# Patient Record
Sex: Female | Born: 1980 | Race: White | Hispanic: No | Marital: Married | Smoking: Never smoker
Health system: Southern US, Community
[De-identification: ages and names within clinical notes are randomized; demographics above are authoritative.]

## PROBLEM LIST (undated history)

## (undated) DIAGNOSIS — Z5189 Encounter for other specified aftercare: Secondary | ICD-10-CM

## (undated) HISTORY — PX: EYE SURGERY: SHX253

## (undated) HISTORY — DX: Encounter for other specified aftercare: Z51.89

---
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
## 2004-03-16 ENCOUNTER — Ambulatory Visit (HOSPITAL_BASED_OUTPATIENT_CLINIC_OR_DEPARTMENT_OTHER): Admission: RE | Admit: 2004-03-16 | Discharge: 2004-03-16 | Payer: Self-pay | Admitting: Obstetrics and Gynecology

## 2004-03-16 ENCOUNTER — Ambulatory Visit (HOSPITAL_COMMUNITY): Admission: RE | Admit: 2004-03-16 | Discharge: 2004-03-16 | Payer: Self-pay | Admitting: Obstetrics and Gynecology

## 2004-03-16 ENCOUNTER — Encounter (INDEPENDENT_AMBULATORY_CARE_PROVIDER_SITE_OTHER): Payer: Self-pay | Admitting: Specialist

## 2008-02-21 ENCOUNTER — Inpatient Hospital Stay (HOSPITAL_COMMUNITY): Admission: AD | Admit: 2008-02-21 | Discharge: 2008-02-25 | Payer: Self-pay | Admitting: Obstetrics and Gynecology

## 2008-02-22 ENCOUNTER — Encounter (INDEPENDENT_AMBULATORY_CARE_PROVIDER_SITE_OTHER): Payer: Self-pay | Admitting: Obstetrics and Gynecology

## 2009-08-04 ENCOUNTER — Ambulatory Visit (HOSPITAL_COMMUNITY): Admission: RE | Admit: 2009-08-04 | Discharge: 2009-08-04 | Payer: Self-pay | Admitting: Obstetrics and Gynecology

## 2009-09-01 ENCOUNTER — Ambulatory Visit (HOSPITAL_COMMUNITY): Admission: RE | Admit: 2009-09-01 | Discharge: 2009-09-01 | Payer: Self-pay | Admitting: Obstetrics and Gynecology

## 2009-09-28 ENCOUNTER — Ambulatory Visit (HOSPITAL_COMMUNITY): Admission: RE | Admit: 2009-09-28 | Discharge: 2009-09-28 | Payer: Self-pay | Admitting: Obstetrics and Gynecology

## 2009-10-26 ENCOUNTER — Ambulatory Visit (HOSPITAL_COMMUNITY): Admission: RE | Admit: 2009-10-26 | Discharge: 2009-10-26 | Payer: Self-pay | Admitting: Obstetrics and Gynecology

## 2009-11-23 ENCOUNTER — Ambulatory Visit (HOSPITAL_COMMUNITY)
Admission: RE | Admit: 2009-11-23 | Discharge: 2009-11-23 | Payer: Self-pay | Source: Home / Self Care | Admitting: Obstetrics and Gynecology

## 2009-12-17 ENCOUNTER — Encounter (INDEPENDENT_AMBULATORY_CARE_PROVIDER_SITE_OTHER): Payer: Self-pay | Admitting: Obstetrics and Gynecology

## 2009-12-17 ENCOUNTER — Inpatient Hospital Stay (HOSPITAL_COMMUNITY): Admission: RE | Admit: 2009-12-17 | Discharge: 2009-12-20 | Payer: Self-pay | Admitting: Obstetrics and Gynecology

## 2010-08-31 ENCOUNTER — Encounter
Admission: RE | Admit: 2010-08-31 | Discharge: 2010-08-31 | Payer: Self-pay | Source: Home / Self Care | Attending: Endocrinology | Admitting: Endocrinology

## 2010-09-25 ENCOUNTER — Other Ambulatory Visit: Payer: Self-pay | Admitting: Endocrinology

## 2010-09-25 DIAGNOSIS — E041 Nontoxic single thyroid nodule: Secondary | ICD-10-CM

## 2010-10-12 ENCOUNTER — Other Ambulatory Visit: Payer: Self-pay

## 2010-10-19 ENCOUNTER — Other Ambulatory Visit (HOSPITAL_COMMUNITY)
Admission: RE | Admit: 2010-10-19 | Discharge: 2010-10-19 | Disposition: A | Discharge status: Home / Self Care | Payer: BC Managed Care – PPO | Source: Ambulatory Visit | Attending: Interventional Radiology | Admitting: Interventional Radiology

## 2010-10-19 ENCOUNTER — Other Ambulatory Visit: Payer: Self-pay

## 2010-10-19 ENCOUNTER — Other Ambulatory Visit: Payer: Self-pay | Admitting: Interventional Radiology

## 2010-10-19 DIAGNOSIS — E049 Nontoxic goiter, unspecified: Secondary | ICD-10-CM | POA: Insufficient documentation

## 2010-10-20 ENCOUNTER — Ambulatory Visit
Admission: RE | Admit: 2010-10-20 | Discharge: 2010-10-20 | Disposition: A | Discharge status: Home / Self Care | Payer: BC Managed Care – PPO | Source: Ambulatory Visit | Attending: Endocrinology | Admitting: Endocrinology

## 2010-10-20 ENCOUNTER — Other Ambulatory Visit: Payer: Self-pay | Admitting: Interventional Radiology

## 2010-10-20 DIAGNOSIS — E041 Nontoxic single thyroid nodule: Secondary | ICD-10-CM

## 2010-11-23 LAB — CBC
MCV: 85.8 fL (ref 78.0–100.0)
Platelets: 131 10*3/uL — ABNORMAL LOW (ref 150–400)
RDW: 14.8 % (ref 11.5–15.5)
WBC: 13.4 10*3/uL — ABNORMAL HIGH (ref 4.0–10.5)

## 2010-11-24 LAB — RPR: RPR Ser Ql: NONREACTIVE

## 2010-11-24 LAB — CBC
HCT: 33.4 % — ABNORMAL LOW (ref 36.0–46.0)
Platelets: 156 10*3/uL (ref 150–400)
RDW: 15.2 % (ref 11.5–15.5)
WBC: 11.1 10*3/uL — ABNORMAL HIGH (ref 4.0–10.5)

## 2010-11-24 LAB — TYPE AND SCREEN
ABO/RH(D): O POS
Antibody Screen: NEGATIVE

## 2011-01-18 NOTE — H&P (Signed)
[name], DESCHEPPER NO.:  [id number]   MEDICAL RECORD NO.:  [id number]          PATIENT TYPE:  INP   LOCATION:  9168                          FACILITY:  [facility]   PHYSICIAN:  Carrie Oconnell, M.D.DATE OF BIRTH:  01-07-1981   DATE OF ADMISSION:  02/21/2008  DATE OF DISCHARGE:                              HISTORY & PHYSICAL   Indication for induction LGA.  She is a 30 year old white female and G1,  P0 with 38 and half weeks.  Estimated fetal weight greater than the 98th  percentile and unfavorable cervix who presents desiring induction.  Risk  and benefits were discussed.  Acknowledges the possible risks and  accuracies of ultrasound in conjunction with the risk of induction given  unfavorable cervix and persistent estimated fetal weight of greater than  98th percentile.  She wishes to proceed.   PAST MEDICAL HISTORY:  Remarkable for HPV.  No medical or surgical  hospitalizations.   OBSTETRIC HISTORY:  Noncontributory.   FAMILY HISTORY:  Hypertension, diabetes, and colon cancer.   SOCIAL HISTORY:  She is a nonsmoker.  Nondrinker.  She denies domestic  or physical violence.   ALLERGIES:  No known drug allergies.   Prenatal course complicated by size-date discrepancy and presumed LGA.   PHYSICAL EXAMINATION:  GENERAL:  She is a well-developed, well-nourished  white female in no acute distress.  HEENT:  Normal.  LUNGS:  Clear.  HEART:  Regular rate and rhythm.  ABDOMEN:  Soft, gravid, and nontender.  Estimated fetal weight by  Lepel's 8-1/2 to 9 pounds.  Cervix is 2-3 cm, 70-80% effaced, vertex -1.  EXTREMITIES:  No cords.  NEUROLOGIC:  Nonfocal.  SKIN:  Intact.   NST is reactive.   IMPRESSION:  A 38-plus-week intrauterine pregnancy with presumed large  for gestational age for induction.   PLAN:  To proceed with Pitocin epidural as needed and anticipated  attempts at vaginal delivery.      Carrie Oconnell, M.D.  Electronically Signed     RJT/MEDQ  D:  02/21/2008  T:  02/22/2008  Job:  [id number]

## 2011-01-18 NOTE — Op Note (Signed)
Carrie Oconnell, TAULBEE NO.:  [id number]   MEDICAL RECORD NO.:  [id number]          PATIENT TYPE:  INP   LOCATION:  9373                          FACILITY:  [facility]   PHYSICIAN:  Lenoard Aden, M.D.DATE OF BIRTH:  1981/08/05   DATE OF PROCEDURE:  DATE OF DISCHARGE:                               OPERATIVE REPORT   PREOPERATIVE DIAGNOSIS:  Term intrauterine pregnancy, failure to  descend.   POSTOPERATIVE DIAGNOSIS:  Term intrauterine pregnancy, severe uterine  atony.   PROCEDURE:  Primary low segment transverse cesarean section and B-Lynch  suturing for treatment of uterine atony.   SURGEON:  Lenoard Aden, M.D. and Marlinda Mike, C.N.M.   ANESTHESIA:  Spinal and epidural by Dr. Pamalee Leyden.   ESTIMATED BLOOD LOSS:  2025 mL.   COMPLICATIONS:  Uterine atony as noted.   DRAINS:  Foley.   COUNTS:  Correct.   The patient to recovery in stable condition.   OPERATIVE NOTE:  After being apprised of risks of anesthesia, infection,  bleeding, injury to abdominal organs, need for repair, the labor's  immediate complications to include bowel and bladder injury, the patient  was brought to the operating room where she was administered epidural  anesthetic.  After awaiting approximately 10 minutes, the epidural  anesthetic was found to be inadequate, a spinal was placed by Dr.  Pamalee Leyden.  The patient was prepped and draped in the usual sterile  fashion.  A Foley catheter was previously placed.  After achieving  adequate anesthesia at this time __________  was placed.  Then incision  was made with a scalpel and carried down to the fascia.  Then carried  transversely.  Rectus muscles were identified bluntly over the midline  in the peritoneum.  Bladder blade was placed.  Vesicoperitoneum was  opened sharply in the lower uterine segment for a hysterotomy.  __________  The infant was handed over to the pediatrician.  Infant with  Apgars  of 9 at 9.  Cord blood obtained and  sent to laboratory.  Three  vessel cord noted.  Uterus was curetted using dry lap,  and closed with  2 running intracuticular layers of 0 Vicryl suture without difficulty.  At this time, it was noted that the progressive  uterine atony was  encountered.  Upon placement of 40 units of injection, an IV solution  given directly into the myometrium with minimal __________  severe  uterine atony was still encountered.  At this time a decision was made  to proceed with B-Lynch suture.  Hysterectomy table was called for; the  patient was typed and screened and  crossmatched with  2 units of blood.  At this time, consultation with Dr. Mia Creek was had regarding the  placement of  Lynch sutures and multiple B-Lynch modification sutures  were placed using a #1Vicryl with a long Keith needle placed front to  back, back to front square knot type fashion and tied with compression  sutures.  These were started in the upper fundal region from side, front  to back, side to side, taken down in front to side and  down to the lower  uterine segment, with care to avoid surrounding injury.  At this time,  good compression of the uterus was achieved.  Good hemostasis was  achieved.  The uterine atony  was marked at this time; the patient's  vitals were stable.  Blood pressure was good and stable.  At this time,  good hemostasis was noted.  Irrigation was accomplished.  Bladder flaps  sutured and were noted to be hemostatic.  Flaps were then closed with 0  Monocryl.  Skin reapproximated with skin staples.  The patient tolerated  the patient procedure well.  She was transferred to recovery room in  guarded condition and stable.      Lenoard Aden, M.D.  Electronically Signed     RJT/MEDQ  D:  02/23/2008  T:  02/23/2008  Job:  [id number]

## 2011-01-21 NOTE — Op Note (Signed)
NAME:  Carrie Oconnell, Carrie Oconnell                  ACCOUNT NO.:  [id number]   MEDICAL RECORD NO.:  [id number]                   PATIENT TYPE:  AMB   LOCATION:  [facility]                                 FACILITY:  [facility]   PHYSICIAN:  Sherry A. Rosalio Macadamia, M.D.           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   PROCEDURE:  Laser vaporization of the cervix and loop electrosurgical  excision procedure of the cervix.   SURGEON:  Sherry A. Rosalio Macadamia, M.D.   ANESTHESIA:  General.   INDICATIONS:  This is a 30 year old G0, P0, woman who has had abnormal Pap  smears, which were evaluated in the office.  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  Because of the degree of abnormality, the location of the abnormality, and  the fact that the patient has a very sensitive vagal reaction and has a  history of fainting in the office, the patient is brought to the operating  room for treatment of her cervical dysplasia in the operating room.   FINDINGS:  Cervix consistent with CIN-II changes out on the portio of the  cervix.   PROCEDURE:  The patient was brought into the operating room, given adequate  general anesthesia.  She is placed in the dorsal lithotomy position.  Her  perineum was draped with wet towels, a speculum was placed within the  vagina.  The vagina was washed with acetic acid.  A paracervical block was  administered with 1% Nesacaine.  The abnormal areas on the cervix were  outlined with the laser with 5 watts power.  Using the small loop, a LEEP  was taken in the center of the cervix to a depth of approximately 8 mm.  The  specimen was removed and pinned out on cork.  The  remaining abnormal areas  of the cervix were then lasered with 35 watts power.  The portio was lasered  to a depth of approximately 3 mm and the os was no further lasering needed  to be performed because the LEEP had been performed; however, using 5 watts  power with a brush technique, the external areas around the portio were  lightly lasered and the surface of the cervix was lasered where the LEEP had  been performed to create adequate hemostasis.  Once hemostasis was present,  the Monsel's solution was placed against the cervix.   ADDENDUM:  Prior to using the laser, endocervical curettings had been  obtained.   The speculum was then removed from the vagina.  The patient was taken out of  the dorsal lithotomy position.  She was awakened.  She was extubated.  She  was moved from the operating table to a stretcher in stable  condition.  Complications were none.  Estimated blood loss less than 5 mL.                                               Sherry A. Rosalio Macadamia, M.D.   SAD/MEDQ  D:  03/16/2004  T:  03/16/2004  Job:  [id number]

## 2011-01-21 NOTE — Discharge Summary (Signed)
Carrie Oconnell, GULLATT               ACCOUNT NO.:  [id number]   MEDICAL RECORD NO.:  [id number]          PATIENT TYPE:  INP   LOCATION:  9131                          FACILITY:  [facility]   PHYSICIAN:  Lenoard Aden, M.D.DATE OF BIRTH:  [birth year]   DATE OF ADMISSION:  02/21/2008  DATE OF DISCHARGE:  02/25/2008                               DISCHARGE SUMMARY   The patient underwent complicated primary C-section.  Procedures  complicated by uterine atony.   Postoperative course complicated by anemia and need for transfusion.  The patient tolerated this procedure well.   She was discharged to home on postop day #3.  Discharge teaching done.  Vicodin and iron given.  Follow up in the office in 1 week for  hemoglobin.      Lenoard Aden, M.D.  Electronically Signed     RJT/MEDQ  D:  03/15/2008  T:  03/16/2008  Job:  [id number]

## 2011-06-02 LAB — DIC (DISSEMINATED INTRAVASCULAR COAGULATION)PANEL
Fibrinogen: 343
Fibrinogen: 359
Platelets: 147 — ABNORMAL LOW
Prothrombin Time: 14.7
Smear Review: NONE SEEN
Smear Review: NONE SEEN
aPTT: 34

## 2011-06-02 LAB — CBC
HCT: 20.2 — ABNORMAL LOW
Hemoglobin: 11.2 — ABNORMAL LOW
Hemoglobin: 13.3
Hemoglobin: 8.3 — ABNORMAL LOW
MCHC: 33.7
MCHC: 34.7
MCHC: 34.7
MCHC: 34.9
MCV: 96.5
MCV: 96.7
Platelets: 101 — ABNORMAL LOW
Platelets: 140 — ABNORMAL LOW
Platelets: 146 — ABNORMAL LOW
Platelets: 95 — ABNORMAL LOW
RBC: 1.75 — ABNORMAL LOW
RBC: 2.09 — ABNORMAL LOW
RBC: 2.46 — ABNORMAL LOW
RBC: 3.41 — ABNORMAL LOW
RDW: 13.1
RDW: 16.7 — ABNORMAL HIGH
WBC: 11.9 — ABNORMAL HIGH
WBC: 12.5 — ABNORMAL HIGH
WBC: 16 — ABNORMAL HIGH
WBC: 19.8 — ABNORMAL HIGH

## 2011-06-02 LAB — CROSSMATCH
ABO/RH(D): O POS
Antibody Screen: NEGATIVE

## 2011-06-02 LAB — RPR: RPR Ser Ql: NONREACTIVE

## 2011-06-02 LAB — HEMOGLOBIN AND HEMATOCRIT, BLOOD
HCT: 25.4 — ABNORMAL LOW
Hemoglobin: 8.9 — ABNORMAL LOW
Hemoglobin: 8.9 — ABNORMAL LOW

## 2011-11-09 ENCOUNTER — Encounter: Payer: Self-pay | Admitting: Internal Medicine

## 2011-11-09 ENCOUNTER — Other Ambulatory Visit (INDEPENDENT_AMBULATORY_CARE_PROVIDER_SITE_OTHER): Payer: BC Managed Care – PPO

## 2011-11-09 ENCOUNTER — Ambulatory Visit (INDEPENDENT_AMBULATORY_CARE_PROVIDER_SITE_OTHER): Payer: BC Managed Care – PPO | Admitting: Internal Medicine

## 2011-11-09 VITALS — BP 90/62 | HR 68 | Temp 98.5°F | Ht 63.0 in | Wt 132.0 lb

## 2011-11-09 DIAGNOSIS — R5381 Other malaise: Secondary | ICD-10-CM

## 2011-11-09 DIAGNOSIS — R5383 Other fatigue: Secondary | ICD-10-CM

## 2011-11-09 DIAGNOSIS — E049 Nontoxic goiter, unspecified: Secondary | ICD-10-CM

## 2011-11-09 DIAGNOSIS — J209 Acute bronchitis, unspecified: Secondary | ICD-10-CM

## 2011-11-09 LAB — BASIC METABOLIC PANEL
BUN: 9 mg/dL (ref 6–23)
Chloride: 102 mEq/L (ref 96–112)
GFR: 99.69 mL/min (ref >60.00)
Potassium: 4.1 mEq/L (ref 3.5–5.1)
Sodium: 140 mEq/L (ref 135–145)

## 2011-11-09 LAB — HEPATIC FUNCTION PANEL
ALT: 18 U/L (ref 0–35)
AST: 17 U/L (ref 0–37)
Alkaline Phosphatase: 62 U/L (ref 39–117)
Total Bilirubin: 0 mg/dL — ABNORMAL LOW (ref 0.3–1.2)

## 2011-11-09 LAB — TSH: TSH: 0.61 u[IU]/mL (ref 0.35–5.50)

## 2011-11-09 LAB — CBC WITH DIFFERENTIAL/PLATELET
Basophils Absolute: 0 10*3/uL (ref 0.0–0.1)
Basophils Relative: 0.3 % (ref 0.0–3.0)
Eosinophils Absolute: 0.2 10*3/uL (ref 0.0–0.7)
Hemoglobin: 14.5 g/dL (ref 12.0–15.0)
Lymphocytes Relative: 30.1 % (ref 12.0–46.0)
MCHC: 33.3 g/dL (ref 30.0–36.0)
Monocytes Relative: 10.6 % (ref 3.0–12.0)
Neutro Abs: 4.3 10*3/uL (ref 1.4–7.7)
Neutrophils Relative %: 56 % (ref 43.0–77.0)
RBC: 4.77 Mil/uL (ref 3.87–5.11)
RDW: 12.7 % (ref 11.5–14.6)

## 2011-11-09 MED ORDER — HYDROCODONE-HOMATROPINE 5-1.5 MG/5ML PO SYRP: 5.0000 mL | ORAL_SOLUTION | Freq: Four times a day (QID) | ORAL | Status: AC | PRN

## 2011-11-09 MED ORDER — AZITHROMYCIN 250 MG PO TABS: ORAL_TABLET | ORAL | Status: AC

## 2011-11-09 NOTE — Progress Notes (Signed)
  Subjective:    HPI  complains of cold symptoms  Onset >1 week ago, progressive symptoms  associated with rhinorrhea, sneezing, sore throat, mild headache and low grade fever Also myalgias, sinus pressure and mild-mod chest congestion No relief with OTC meds Precipitated by sick contacts - work and home  Also would like labs checked today for thyroid and iron  Past Medical History  Diagnosis Date  . History of chicken pox   . Thyroid nodule     Review of Systems Constitutional: No fever or night sweats, no unexpected weight change; complains of fatigue x 2 months Pulmonary: No pleurisy or hemoptysis Cardiovascular: No chest pain or palpitations     Objective:   Physical Exam BP 90/62  Pulse 68  Temp(Src) 98.5 F (36.9 C) (Oral)  Ht 5\' 3"  (1.6 m)  Wt 132 lb (59.875 kg)  BMI 23.38 kg/m2  SpO2 98% GEN: mildly ill appearing and audible chest congestion HENT: NCAT, mild sinus tenderness bilaterally, nares with clear discharge, oropharynx mod erythema, no exudate Eyes: Vision grossly intact, mild L conjunctivitis Neck: B nodular goiter, non tender, no LAD  Lungs: scattered rhonchi, no wheeze, no increased work of breathing Cardiovascular: Regular rate and rhythm, no bilateral edema  Lab Results  Component Value Date   WBC 13.4* 12/18/2009   HGB 7.4 DELTA CHECK NOTED REPEATED TO VERIFY* 12/18/2009   HCT 22.6* 12/18/2009   PLT 131* 12/18/2009   INR 1.2 02/23/2008       Assessment & Plan:  Viral URI > acute bronchitis Mild conjunctivitis, L Cough, postnasal drip related to above Fatigue x 2 mo Thyroid goiter/nodule - benign bx 2012   Empiric antibiotics prescribed due to symptom duration greater than 7 days Prescription cough suppression - new prescriptions done Symptomatic care with Tylenol or Advil, hydration and rest -  salt gargle advised as needed  Fatigue nonspecific - check labs  Thyroid nodule - ?hypothyroid - no follow up >70mo- hcek labs now and [place]  annually

## 2011-11-09 NOTE — Patient Instructions (Signed)
It was good to see you today. Zpak antibiotics and Hydromet cough syrup for bronchitis symptoms - Your prescription(s) have been submitted to your pharmacy. Please take as directed and contact our office if you believe you are having problem(s) with the medication(s). Test(s) ordered today. Your results will be called to you after review (48-72hours after test completion). If any changes need to be made, you will be notified at that time. Please schedule followup in 6 months to monitor thyroid, call sooner if other problems.

## 2011-12-12 ENCOUNTER — Other Ambulatory Visit (INDEPENDENT_AMBULATORY_CARE_PROVIDER_SITE_OTHER): Payer: BC Managed Care – PPO

## 2011-12-12 ENCOUNTER — Encounter: Payer: Self-pay | Admitting: Internal Medicine

## 2011-12-12 ENCOUNTER — Ambulatory Visit (INDEPENDENT_AMBULATORY_CARE_PROVIDER_SITE_OTHER): Payer: BC Managed Care – PPO | Admitting: Internal Medicine

## 2011-12-12 VITALS — BP 90/60 | HR 76 | Temp 98.4°F | Resp 16 | Ht 63.0 in | Wt 132.0 lb

## 2011-12-12 DIAGNOSIS — E049 Nontoxic goiter, unspecified: Secondary | ICD-10-CM

## 2011-12-12 LAB — T4, FREE: Free T4: 0.9 ng/dL (ref 0.60–1.60)

## 2011-12-12 NOTE — Assessment & Plan Note (Addendum)
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

## 2011-12-12 NOTE — Progress Notes (Signed)
  Subjective:    Patient ID: Carrie Oconnell, female    DOB: 13-Nov-1980, 31 y.o.   MRN: [id number]  HPI  Concerned about thyroid goiter Reports increased swelling and pain in anterior neck last week Symptoms lasted 2 days before spontaneous resolution Not consistently taking thyroid replacement because her understanding is "medication is for cosmetic purposes only" Concerned about possible hypothyroid symptoms including fatigue, weight gain, skin changes and dysphoric mood Also reports difficulty swallowing at times with increased phlegm production Denies cough, shortness of breath or heartburn symptoms  Past Medical History  Diagnosis Date  . History of chicken pox   . 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    Review of Systems  HENT: Negative for facial swelling, sneezing, neck stiffness and postnasal drip.   Cardiovascular: Negative for chest pain and leg swelling.       Objective:   Physical Exam BP 90/60  Pulse 76  Temp(Src) 98.4 F (36.9 C) (Oral)  Resp 16  Ht 5\' 3"  (1.6 m)  Wt 132 lb (59.875 kg)  BMI 23.38 kg/m2  SpO2 98% Wt Readings from Last 3 Encounters:  12/12/11 132 lb (59.875 kg)  11/09/11 132 lb (59.875 kg)   Constitutional: She appears well-developed and well-nourished. No distress.  Eyes: Conjunctivae and EOM are normal. Pupils are equal, round, and reactive to light. No scleral icterus.  Neck: L>R thyroid nodules with thyromegaly present.  nontender to palpation Cardiovascular: Normal rate, regular rhythm and normal heart sounds.  No murmur heard. No BLE edema. Pulmonary/Chest: Effort normal and breath sounds normal. No respiratory distress. She has no wheezes.  Psychiatric: She has a dysphoric mood and affect. Her behavior is normal. Judgment and thought content normal.   Lab Results  Component Value Date   WBC 7.7 11/09/2011   HGB 14.5 11/09/2011   HCT 43.6 11/09/2011   PLT 215.0 11/09/2011   GLUCOSE 86 11/09/2011   ALT 18 11/09/2011    AST 17 11/09/2011   NA 140 11/09/2011   K 4.1 11/09/2011   CL 102 11/09/2011   CREATININE 0.7 11/09/2011   BUN 9 11/09/2011   CO2 31 11/09/2011   TSH 0.61 11/09/2011   INR 1.2 02/23/2008        Assessment & Plan:  See problem list. Medications and labs reviewed today.

## 2011-12-12 NOTE — Patient Instructions (Addendum)
It was good to see you today. Test(s) ordered today. Your results will be called to you after review (48-72hours after test completion). If any changes need to be made, you will be notified at that time. we'll make referral for repeat thyroid ultrasound. Our office will contact you regarding appointment(s) once made. we will send to your prior provider(s) for "release of records" as discussed today -  Please schedule followup in 2 weeks to review thyroid results in person, call sooner if other problems.

## 2011-12-15 ENCOUNTER — Ambulatory Visit
Admission: RE | Admit: 2011-12-15 | Discharge: 2011-12-15 | Disposition: A | Discharge status: Home / Self Care | Payer: BC Managed Care – PPO | Source: Ambulatory Visit | Attending: Internal Medicine | Admitting: Internal Medicine

## 2011-12-15 DIAGNOSIS — E049 Nontoxic goiter, unspecified: Secondary | ICD-10-CM

## 2011-12-27 ENCOUNTER — Encounter: Payer: Self-pay | Admitting: Internal Medicine

## 2011-12-27 ENCOUNTER — Ambulatory Visit (INDEPENDENT_AMBULATORY_CARE_PROVIDER_SITE_OTHER): Payer: BC Managed Care – PPO | Admitting: Internal Medicine

## 2011-12-27 VITALS — BP 98/72 | HR 72 | Temp 98.4°F | Ht 63.0 in | Wt 131.0 lb

## 2011-12-27 DIAGNOSIS — E049 Nontoxic goiter, unspecified: Secondary | ICD-10-CM

## 2011-12-27 DIAGNOSIS — K219 Gastro-esophageal reflux disease without esophagitis: Secondary | ICD-10-CM

## 2011-12-27 MED ORDER — LEVOTHYROXINE SODIUM 50 MCG PO TABS: 50.0000 ug | ORAL_TABLET | Freq: Every day | ORAL | Status: DC

## 2011-12-27 MED ORDER — FAMOTIDINE 20 MG PO TABS: 20.0000 mg | ORAL_TABLET | Freq: Every day | ORAL | Status: DC

## 2011-12-27 NOTE — Patient Instructions (Signed)
It was good to see you today. We have reviewed your recent records including labs and tests today Resume Sythroid daily as discussed and star Pepcid 20g at bedtime until ou next office visit Please schedule followup in 6-12 weeks to recheck labs and review symptoms in person, call sooner if other problems.

## 2011-12-27 NOTE — Progress Notes (Signed)
Subjective:    Patient ID: Carrie Oconnell, female    DOB: [birth year], 31 y.o.   MRN: [id number]  HPI  follow up regarding thyroid nodules and goiter Reports increased swelling and pain in anterior neck late 11/2011 Symptoms lasted 2 days before spontaneous resolution Not consistently taking thyroid replacement as previously prescribed because her understanding is "medication is for cosmetic purposes only" Remains concerned about possible hypothyroid symptoms including fatigue, weight gain, skin changes and dysphoric mood Also reports difficulty swallowing at times and increased phlegm production - not improved with OTC H2B as advised Denies cough, shortness of breath or heartburn symptoms  Past Medical History  Diagnosis Date  . History of chicken pox   . 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 Negative for unexpected weight change.  HENT: Negative for neck pain and neck stiffness.   Cardiovascular: Negative for chest pain, palpitations and leg swelling.       Objective:   Physical Exam  BP 98/72  Pulse 72  Temp(Src) 98.4 F (36.9 C) (Oral)  Ht 5\' 3"  (1.6 m)  Wt 131 lb (59.421 kg)  BMI 23.21 kg/m2  SpO2 97% Wt Readings from Last 3 Encounters:  12/27/11 131 lb (59.421 kg)  12/12/11 132 lb (59.875 kg)  11/09/11 132 lb (59.875 kg)   Constitutional: She appears well-developed and well-nourished. No distress.  Eyes: Conjunctivae and EOM are normal. Pupils are equal, round, and reactive to light. No scleral icterus.  Neck: L>R thyroid nodules with thyromegaly present.  nontender to palpation Cardiovascular: Normal rate, regular rhythm and normal heart sounds.  No murmur heard. No BLE edema. Pulmonary/Chest: Effort normal and breath sounds normal. No respiratory distress. She has no wheezes.  Psychiatric: She has a dysphoric mood and affect. Her behavior is normal. Judgment and thought content  normal.   Lab Results  Component Value Date   WBC 7.7 11/09/2011   HGB 14.5 11/09/2011   HCT 43.6 11/09/2011   PLT 215.0 11/09/2011   GLUCOSE 86 11/09/2011   ALT 18 11/09/2011   AST 17 11/09/2011   NA 140 11/09/2011   K 4.1 11/09/2011   CL 102 11/09/2011   CREATININE 0.7 11/09/2011   BUN 9 11/09/2011   CO2 31 11/09/2011   TSH 0.61 11/09/2011   INR 1.2 02/23/2008   US Soft Tissue Head/neck  12/15/2011  *RADIOLOGY REPORT*  Clinical Data: Bilateral thyroid nodules.  Previous FNA biopsy of bilateral dominant nodules 10/20/2010.  THYROID ULTRASOUND  Technique: Ultrasound examination of the thyroid gland and adjacent soft tissues was performed.  Comparison:  10/20/2010 and earlier studies  Findings:  Right thyroid lobe:  18 x 22 x 48 mm, inhomogeneous Left thyroid lobe:  17 x 23 x 53 mm Isthmus:  1.5 mm in thickness  Focal nodules:  15 x 18 x 25 mm complex with calcifications, mid- right (previously 17 x 21 x 29) 13 x 20 x 22 mm complex, mid-left (previously 12 x 16 x 23) 12 x 14 x 16 mm complex, superior left (previously 9 x 14 x 15) There are additional smaller bilateral nodules measuring 7 mm or less maximal diameter.  Lymphadenopathy:  None visualized.  IMPRESSION:  Little interval change in size of dominant bilateral complex thyroid nodules.  Correlate with previous biopsy results.  Original Report Authenticated By: Osa Craver, M.D.      Assessment & Plan:  See problem list.  Medications and labs reviewed today.

## 2011-12-27 NOTE — Assessment & Plan Note (Signed)
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

## 2011-12-27 NOTE — Assessment & Plan Note (Signed)
Start daily H2B OTC for "phlegm" symptoms x next 6-12 weeks

## 2012-03-13 ENCOUNTER — Ambulatory Visit: Payer: BC Managed Care – PPO | Admitting: Internal Medicine

## 2012-03-13 DIAGNOSIS — Z0289 Encounter for other administrative examinations: Secondary | ICD-10-CM

## 2012-05-09 ENCOUNTER — Other Ambulatory Visit (INDEPENDENT_AMBULATORY_CARE_PROVIDER_SITE_OTHER): Payer: BC Managed Care – PPO

## 2012-05-09 ENCOUNTER — Telehealth: Payer: Self-pay | Admitting: Internal Medicine

## 2012-05-09 ENCOUNTER — Encounter: Payer: Self-pay | Admitting: Internal Medicine

## 2012-05-09 ENCOUNTER — Ambulatory Visit (INDEPENDENT_AMBULATORY_CARE_PROVIDER_SITE_OTHER): Payer: BC Managed Care – PPO | Admitting: Internal Medicine

## 2012-05-09 VITALS — BP 102/62 | HR 70 | Temp 97.8°F | Ht 63.0 in | Wt 135.1 lb

## 2012-05-09 DIAGNOSIS — J309 Allergic rhinitis, unspecified: Secondary | ICD-10-CM

## 2012-05-09 DIAGNOSIS — E049 Nontoxic goiter, unspecified: Secondary | ICD-10-CM

## 2012-05-09 LAB — T4, FREE: Free T4: 0.81 ng/dL (ref 0.60–1.60)

## 2012-05-09 MED ORDER — LORATADINE 10 MG PO TABS: 10.0000 mg | ORAL_TABLET | Freq: Every day | ORAL | Status: DC

## 2012-05-09 MED ORDER — LEVOTHYROXINE SODIUM 50 MCG PO TABS: 50.0000 ug | ORAL_TABLET | Freq: Every day | ORAL | Status: DC

## 2012-05-09 NOTE — Progress Notes (Signed)
  Subjective:    Patient ID: Carrie Oconnell, female    DOB: [birth year], 31 y.o.   MRN: [id number]  HPI  Here for follow up regarding thyroid nodules and goiter Hx transient pain and swelling anterior neck late 11/2011 x 2 days before spontaneous resolution Intermittently taking thyroid replacement since 12/2011 "for cosmetic purposes only" Chronic thyroid type symptoms unchanged: fatigue, weight gain, skin changes and dysphoric mood Denies cough, shortness of breath or heartburn symptoms +allergy symptoms - phlegm in AM  Past Medical History  Diagnosis Date  . History of chicken pox   . 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 Negative for unexpected weight change.  HENT: Negative for neck pain and neck stiffness.   Cardiovascular: Negative for chest pain, palpitations and leg swelling.       Objective:   Physical Exam  BP 102/62  Pulse 70  Temp 97.8 F (36.6 C) (Oral)  Ht 5\' 3"  (1.6 m)  Wt 135 lb 1.9 oz (61.29 kg)  BMI 23.94 kg/m2  SpO2 98% Wt Readings from Last 3 Encounters:  05/09/12 135 lb 1.9 oz (61.29 kg)  12/27/11 131 lb (59.421 kg)  12/12/11 132 lb (59.875 kg)   Constitutional: She appears well-developed and well-nourished. No distress.  Eyes: Conjunctivae and EOM are normal. Pupils are equal, round, and reactive to light. No scleral icterus.  Neck: L>R thyroid nodules with thyromegaly present.  nontender to palpation Cardiovascular: Normal rate, regular rhythm and normal heart sounds.  No murmur heard. No BLE edema. Pulmonary/Chest: Effort normal and breath sounds normal. No respiratory distress. She has no wheezes.  Psychiatric: She has a dysphoric mood and affect. Her behavior is normal. Judgment and thought content normal.   Lab Results  Component Value Date   WBC 7.7 11/09/2011   HGB 14.5 11/09/2011   HCT 43.6 11/09/2011   PLT 215.0 11/09/2011   GLUCOSE 86 11/09/2011   ALT 18  11/09/2011   AST 17 11/09/2011   NA 140 11/09/2011   K 4.1 11/09/2011   CL 102 11/09/2011   CREATININE 0.7 11/09/2011   BUN 9 11/09/2011   CO2 31 11/09/2011   TSH 0.61 11/09/2011   INR 1.2 02/23/2008   US Soft Tissue Head/neck  12/15/2011  *RADIOLOGY REPORT*  Clinical Data: Bilateral thyroid nodules.  Previous FNA biopsy of bilateral dominant nodules 10/20/2010.  THYROID ULTRASOUND  Technique: Ultrasound examination of the thyroid gland and adjacent soft tissues was performed.  Comparison:  10/20/2010 and earlier studies  Findings:  Right thyroid lobe:  18 x 22 x 48 mm, inhomogeneous Left thyroid lobe:  17 x 23 x 53 mm Isthmus:  1.5 mm in thickness  Focal nodules:  15 x 18 x 25 mm complex with calcifications, mid- right (previously 17 x 21 x 29) 13 x 20 x 22 mm complex, mid-left (previously 12 x 16 x 23) 12 x 14 x 16 mm complex, superior left (previously 9 x 14 x 15) There are additional smaller bilateral nodules measuring 7 mm or less maximal diameter.  Lymphadenopathy:  None visualized.  IMPRESSION:  Little interval change in size of dominant bilateral complex thyroid nodules.  Correlate with previous biopsy results.  Original Report Authenticated By: Osa Craver, M.D.      Assessment & Plan:  See problem list. Medications and labs reviewed today.

## 2012-05-09 NOTE — Assessment & Plan Note (Signed)
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

## 2012-05-09 NOTE — Patient Instructions (Addendum)
It was good to see you today. Test(s) ordered today. Your results will be called to you after review (48-72hours after test completion). If any changes need to be made, you will be notified at that time. Use daily Claritin for 30 days  Please schedule followup in 6 months to review thyroid medications, call sooner if other problems.

## 2012-05-09 NOTE — Assessment & Plan Note (Signed)
AM phlegm symptoms Slightly improved with Pepcid trial 12/2011 Increasing symptoms with change weather/season OTC claritin x30d - call if unimproved for refer as needed

## 2012-05-09 NOTE — Telephone Encounter (Signed)
The patient called and is hoping to get her no-show fee removed from her 03/13/12 visit.  She states she did not know she had an appointment.  Thanks!

## 2012-05-09 NOTE — Telephone Encounter (Signed)
Received call from Shakira need T-3 reverse order made future. Inform her will re-enter..../LMB

## 2012-05-13 LAB — T3, REVERSE: T3, Reverse: 11.6 ng/dL (ref 9.0–27.0)

## 2012-09-10 ENCOUNTER — Ambulatory Visit (INDEPENDENT_AMBULATORY_CARE_PROVIDER_SITE_OTHER): Payer: BC Managed Care – PPO | Admitting: Internal Medicine

## 2012-09-10 ENCOUNTER — Encounter: Payer: Self-pay | Admitting: Internal Medicine

## 2012-09-10 VITALS — BP 92/72 | HR 52 | Temp 98.0°F | Ht 63.0 in | Wt 133.4 lb

## 2012-09-10 DIAGNOSIS — R059 Cough, unspecified: Secondary | ICD-10-CM

## 2012-09-10 DIAGNOSIS — R058 Other specified cough: Secondary | ICD-10-CM

## 2012-09-10 DIAGNOSIS — R05 Cough: Secondary | ICD-10-CM

## 2012-09-10 MED ORDER — BENZONATATE 200 MG PO CAPS: 200.0000 mg | ORAL_CAPSULE | Freq: Three times a day (TID) | ORAL | Status: DC | PRN

## 2012-09-10 MED ORDER — PREDNISONE (PAK) 10 MG PO TABS: 10.0000 mg | ORAL_TABLET | ORAL | Status: DC

## 2012-09-10 NOTE — Patient Instructions (Addendum)
It was good to see you today. If you develop worsening symptoms or fever, call and we can reconsider antibiotics, but it does not appear necessary to use antibiotics at this time. For your cough, will use prednisone taper for next 6 days as directed and tessalon 3x/day as needed (especially night for next week) Your prescription(s) have been submitted to your pharmacy. Please take as directed and contact our office if you believe you are having problem(s) with the medication(s).

## 2012-09-10 NOTE — Progress Notes (Signed)
  Subjective:    HPI  complains of dry cough symptoms  Onset >2 week ago, wax/wane symptoms  Initially associated with rhinorrhea, sneezing, sore throat, mild headache and low grade fever (URI) which has since resolved no myalgias, sinus pressure or chest congestion No relief with OTC meds Occurs day and night, no relation to rest, exertion, talking or meals  Past Medical History  Diagnosis Date  . History of chicken pox   . 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    Review of Systems Constitutional: No fever or night sweats, no unexpected weight change Pulmonary: No pleurisy or hemoptysis Cardiovascular: No chest pain or palpitations     Objective:   Physical Exam BP 92/72  Pulse 52  Temp 98 F (36.7 C) (Oral)  Ht 5\' 3"  (1.6 m)  Wt 133 lb 6.4 oz (60.51 kg)  BMI 23.63 kg/m2  SpO2 97% GEN: nontoxic appearing and no audible head/chest congestion - dry cough spasms HENT: NCAT, no sinus tenderness bilaterally, nares without discharge, oropharynx mild erythema, no exudate Eyes: Vision grossly intact, no conjunctivitis Lungs: Clear to auscultation without rhonchi or wheeze, no increased work of breathing Cardiovascular: Regular rate and rhythm, no bilateral edema      Assessment & Plan:  Viral URI 2 weeks ago, resolved Cough, post viral related to above   Explained lack of efficacy for antibiotics in viral disease Prescription cough suppression and pred taper for inflammation - new prescriptions done Education provided

## 2012-09-17 ENCOUNTER — Ambulatory Visit (INDEPENDENT_AMBULATORY_CARE_PROVIDER_SITE_OTHER): Payer: BC Managed Care – PPO | Admitting: Internal Medicine

## 2012-09-17 ENCOUNTER — Encounter: Payer: Self-pay | Admitting: Internal Medicine

## 2012-09-17 VITALS — BP 100/62 | HR 74 | Temp 97.7°F

## 2012-09-17 DIAGNOSIS — J069 Acute upper respiratory infection, unspecified: Secondary | ICD-10-CM

## 2012-09-17 MED ORDER — AZITHROMYCIN 250 MG PO TABS: ORAL_TABLET | ORAL | Status: DC

## 2012-09-17 MED ORDER — OMEPRAZOLE 40 MG PO CPDR: 40.0000 mg | DELAYED_RELEASE_CAPSULE | Freq: Every day | ORAL | Status: DC

## 2012-09-17 MED ORDER — PHENYLEPH-PROMETHAZINE-COD 5-6.25-10 MG/5ML PO SYRP: 5.0000 mL | ORAL_SOLUTION | ORAL | Status: DC | PRN

## 2012-09-17 NOTE — Patient Instructions (Signed)
It was good to see you today. it does not appear necessary to use antibiotics at this time, but ok to use Zpak if you develop worsening symptoms or fever For your cough, will use prometh syrup as needed for cough (especially at night) and omeprazole daily x 2 weeks as directed  Your prescription(s) have been submitted to your pharmacy. Please take as directed and contact our office if you believe you are having problem(s) with the medication(s).

## 2012-09-17 NOTE — Progress Notes (Signed)
  Subjective:   HPI  complains of continued dry cough symptoms  Onset >3 week ago, wax/wane symptoms - seen here last week for same: tx with tessalon and pred taper - 3 days improvement, then recurrent symptoms  Initially associated with rhinorrhea, sneezing, sore throat, mild headache and low grade fever (URI) which has since recurred no myalgias, sinus pressure or chest congestion No relief with OTC meds Cough occurs day< night, no relation to rest, exertion, talking or meals  Past Medical History  Diagnosis Date  . History of chicken pox   . 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    Review of Systems Constitutional: No fever or night sweats, no unexpected weight change Pulmonary: No pleurisy or hemoptysis Cardiovascular: No chest pain or palpitations     Objective:   Physical Exam BP 100/62  Pulse 74  Temp 97.7 F (36.5 C) (Oral)  SpO2 98% GEN: nontoxic appearing and no audible head/chest congestion - dry cough spasms HENT: NCAT, no sinus tenderness bilaterally, nares without discharge, oropharynx mild erythema, no exudate Eyes: Vision grossly intact, no conjunctivitis Lungs: Clear to auscultation without rhonchi or wheeze, no increased work of breathing Cardiovascular: Regular rate and rhythm, no bilateral edema  Lab Results  Component Value Date   WBC 7.7 11/09/2011   HGB 14.5 11/09/2011   HCT 43.6 11/09/2011   PLT 215.0 11/09/2011   GLUCOSE 86 11/09/2011   ALT 18 11/09/2011   AST 17 11/09/2011   NA 140 11/09/2011   K 4.1 11/09/2011   CL 102 11/09/2011   CREATININE 0.7 11/09/2011   BUN 9 11/09/2011   CO2 31 11/09/2011   TSH 0.49 05/09/2012   INR 1.2 02/23/2008      Assessment & Plan:  Viral URI 3 weeks ago, initally resolved, now recurrent URI symptoms  Cough, post viral related to above - improved with pred pak x 3 days last week new sore throat and myalgia - suspect new viral syndrome   Explained lack of efficacy for antibiotics in viral disease, but rx  Zpak to use if symptoms worse or unimproved - pt understands and agrees Prescription cough/decongestant - new prescriptions done Education provided

## 2012-11-07 ENCOUNTER — Ambulatory Visit: Payer: BC Managed Care – PPO | Admitting: Internal Medicine

## 2013-01-14 ENCOUNTER — Encounter: Payer: Self-pay | Admitting: Internal Medicine

## 2013-01-14 ENCOUNTER — Other Ambulatory Visit (INDEPENDENT_AMBULATORY_CARE_PROVIDER_SITE_OTHER): Payer: BC Managed Care – PPO

## 2013-01-14 ENCOUNTER — Ambulatory Visit (INDEPENDENT_AMBULATORY_CARE_PROVIDER_SITE_OTHER): Payer: BC Managed Care – PPO | Admitting: Internal Medicine

## 2013-01-14 ENCOUNTER — Ambulatory Visit (INDEPENDENT_AMBULATORY_CARE_PROVIDER_SITE_OTHER)
Admission: RE | Admit: 2013-01-14 | Discharge: 2013-01-14 | Disposition: A | Discharge status: Home / Self Care | Payer: BC Managed Care – PPO | Source: Ambulatory Visit | Attending: Internal Medicine | Admitting: Internal Medicine

## 2013-01-14 VITALS — BP 90/68 | HR 69 | Temp 97.9°F | Wt 132.4 lb

## 2013-01-14 DIAGNOSIS — E049 Nontoxic goiter, unspecified: Secondary | ICD-10-CM

## 2013-01-14 DIAGNOSIS — R053 Chronic cough: Secondary | ICD-10-CM

## 2013-01-14 DIAGNOSIS — R05 Cough: Secondary | ICD-10-CM

## 2013-01-14 DIAGNOSIS — R059 Cough, unspecified: Secondary | ICD-10-CM

## 2013-01-14 LAB — CBC
HCT: 41.2 % (ref 36.0–46.0)
Hemoglobin: 13.9 g/dL (ref 12.0–15.0)
MCV: 89.6 fl (ref 78.0–100.0)
Platelets: 201 10*3/uL (ref 150.0–400.0)
RBC: 4.6 Mil/uL (ref 3.87–5.11)
WBC: 7.9 10*3/uL (ref 4.5–10.5)

## 2013-01-14 MED ORDER — LORATADINE 10 MG PO TABS: 10.0000 mg | ORAL_TABLET | Freq: Every day | ORAL | Status: DC

## 2013-01-14 NOTE — Progress Notes (Signed)
  Subjective:    Patient ID: Carrie Oconnell, female    DOB: 1981-08-17, 32 y.o.   MRN: [id number]  HPI  Here for follow up regarding thyroid nodules and goiter Intermittently taking thyroid replacement since 12/2011 "for cosmetic purposes only"  Also cough, dry >22mo Now occasional wheeze with cough - no fever or sputum   Past Medical History  Diagnosis Date  . History of chicken pox   . 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 Negative for fever and unexpected weight change.  HENT: Positive for sneezing and postnasal drip. Negative for neck pain and neck stiffness.   Respiratory: Positive for cough and wheezing. Negative for shortness of breath.   Cardiovascular: Negative for chest pain, palpitations and leg swelling.       Objective:   Physical Exam  BP 90/68  Pulse 69  Temp(Src) 97.9 F (36.6 C) (Oral)  Wt 132 lb 6.4 oz (60.056 kg)  BMI 23.46 kg/m2  SpO2 98% Wt Readings from Last 3 Encounters:  01/14/13 132 lb 6.4 oz (60.056 kg)  09/10/12 133 lb 6.4 oz (60.51 kg)  05/09/12 135 lb 1.9 oz (61.29 kg)   Constitutional: She appears well-developed and well-nourished. No distress.  Eyes: Conjunctivae and EOM are normal. Pupils are equal, round, and reactive to light. No scleral icterus.  Neck: L>R thyroid nodules with thyromegaly present.  nontender to palpation Cardiovascular: Normal rate, regular rhythm and normal heart sounds.  No murmur heard. No BLE edema. Pulmonary/Chest: Effort normal and breath sounds normal. No respiratory distress. She has no wheezes.  Psychiatric: She has a normal mood and affect. Her behavior is normal. Judgment and thought content normal.   Lab Results  Component Value Date   WBC 7.7 11/09/2011   HGB 14.5 11/09/2011   HCT 43.6 11/09/2011   PLT 215.0 11/09/2011   GLUCOSE 86 11/09/2011   ALT 18 11/09/2011   AST 17 11/09/2011   NA 140 11/09/2011   K 4.1 11/09/2011   CL 102  11/09/2011   CREATININE 0.7 11/09/2011   BUN 9 11/09/2011   CO2 31 11/09/2011   TSH 0.49 05/09/2012   INR 1.2 02/23/2008   US Soft Tissue Head/neck  12/15/2011  *RADIOLOGY REPORT*  Clinical Data: Bilateral thyroid nodules.  Previous FNA biopsy of bilateral dominant nodules 10/20/2010.  THYROID ULTRASOUND  Technique: Ultrasound examination of the thyroid gland and adjacent soft tissues was performed.  Comparison:  10/20/2010 and earlier studies  Findings:  Right thyroid lobe:  18 x 22 x 48 mm, inhomogeneous Left thyroid lobe:  17 x 23 x 53 mm Isthmus:  1.5 mm in thickness  Focal nodules:  15 x 18 x 25 mm complex with calcifications, mid- right (previously 17 x 21 x 29) 13 x 20 x 22 mm complex, mid-left (previously 12 x 16 x 23) 12 x 14 x 16 mm complex, superior left (previously 9 x 14 x 15) There are additional smaller bilateral nodules measuring 7 mm or less maximal diameter.  Lymphadenopathy:  None visualized.  IMPRESSION:  Little interval change in size of dominant bilateral complex thyroid nodules.  Correlate with previous biopsy results.  Original Report Authenticated By: Osa Craver, M.D.      Assessment & Plan:  See problem list. Medications and labs reviewed today.

## 2013-01-14 NOTE — Patient Instructions (Signed)
It was good to see you today. We have reviewed your prior records including labs and tests today Test(s) ordered today. Your results will be released to MyChart (or called to you) after review, usually within 72hours after test completion. If any changes need to be made, you will be notified at that same time. we'll make referral all of thyroid ultrasound and for pulmonary function testing because of cough. Our office will contact you regarding appointment(s) once made. Medications reviewed and updated, please take Claritin over-the-counter once daily for next 30 days Please schedule followup in 6 months, call sooner if problems.

## 2013-01-14 NOTE — Assessment & Plan Note (Signed)
Dry cough, no fever - but ongoing >6 mo and occasional wheeze ?airway irritation from goiter - [place] as above Check cxr and refer for PFTs given allergy symptoms Advise Claritin daily -

## 2013-01-14 NOTE — Assessment & Plan Note (Addendum)
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

## 2013-01-15 LAB — TSH: TSH: 1.01 u[IU]/mL (ref 0.35–5.50)

## 2013-01-15 LAB — T4, FREE: Free T4: 0.81 ng/dL (ref 0.60–1.60)

## 2013-01-21 ENCOUNTER — Ambulatory Visit
Admission: RE | Admit: 2013-01-21 | Discharge: 2013-01-21 | Disposition: A | Discharge status: Home / Self Care | Payer: BC Managed Care – PPO | Source: Ambulatory Visit | Attending: Internal Medicine | Admitting: Internal Medicine

## 2013-01-21 DIAGNOSIS — E049 Nontoxic goiter, unspecified: Secondary | ICD-10-CM

## 2013-02-07 ENCOUNTER — Ambulatory Visit (INDEPENDENT_AMBULATORY_CARE_PROVIDER_SITE_OTHER): Payer: BC Managed Care – PPO | Admitting: Internal Medicine

## 2013-02-07 DIAGNOSIS — R059 Cough, unspecified: Secondary | ICD-10-CM

## 2013-02-07 DIAGNOSIS — R05 Cough: Secondary | ICD-10-CM

## 2013-02-07 LAB — PULMONARY FUNCTION TEST

## 2013-02-07 NOTE — Progress Notes (Signed)
PFT done today. 

## 2013-02-18 ENCOUNTER — Encounter: Payer: Self-pay | Admitting: Internal Medicine

## 2013-03-18 ENCOUNTER — Telehealth: Payer: Self-pay | Admitting: *Deleted

## 2013-03-18 NOTE — Telephone Encounter (Signed)
Pt called requesting her results from the PFT in June.  Please advise.

## 2013-03-18 NOTE — Telephone Encounter (Signed)
Her PFTs are normal - no asthma - If continued cough symptoms, let me know and I will refer to pulm for further eval and tx as needed of chronic cough symptoms  thanks

## 2013-03-19 NOTE — Telephone Encounter (Signed)
Spoke with pt advised her of MDs result note.

## 2013-07-26 ENCOUNTER — Ambulatory Visit (INDEPENDENT_AMBULATORY_CARE_PROVIDER_SITE_OTHER): Payer: BC Managed Care – PPO | Admitting: Internal Medicine

## 2013-07-26 ENCOUNTER — Encounter: Payer: Self-pay | Admitting: Internal Medicine

## 2013-07-26 VITALS — BP 102/60 | HR 89 | Temp 97.4°F | Ht 63.0 in | Wt 136.0 lb

## 2013-07-26 DIAGNOSIS — K219 Gastro-esophageal reflux disease without esophagitis: Secondary | ICD-10-CM

## 2013-07-26 DIAGNOSIS — J309 Allergic rhinitis, unspecified: Secondary | ICD-10-CM

## 2013-07-26 DIAGNOSIS — J209 Acute bronchitis, unspecified: Secondary | ICD-10-CM

## 2013-07-26 MED ORDER — AZITHROMYCIN 250 MG PO TABS: ORAL_TABLET | ORAL | Status: DC

## 2013-07-26 MED ORDER — HYDROCODONE-HOMATROPINE 5-1.5 MG/5ML PO SYRP: 5.0000 mL | ORAL_SOLUTION | Freq: Four times a day (QID) | ORAL | Status: DC | PRN

## 2013-07-26 NOTE — Patient Instructions (Signed)
Please take all new medication as prescribed Please continue all other medications as before You can also take OTC Mucinex D (you have to ask for it) to help with the ear symptoms  Please remember to sign up for My Chart if you have not done so, as this will be important to you in the future with finding out test results, communicating by private email, and scheduling acute appointments online when needed.

## 2013-07-26 NOTE — Progress Notes (Signed)
  Subjective:    Patient ID: Carrie Oconnell, female    DOB: Jul 15, 1981, 32 y.o.   MRN: [id number]  HPI  Here with acute onset mild to mod 9 days ST, HA, worsening general weakness and malaise, with prod cough greenish sputum, but Pt denies chest pain, increased sob or doe, wheezing, orthopnea, PND, increased LE swelling, palpitations, dizziness or syncope.  Does have several wks ongoing nasal allergy symptoms with clearish congestion, itch and sneezing, without fever, pain, ST, cough, swelling or wheezing.. Chronic cough improved with PPI. Denies worsening reflux, abd pain, dysphagia, n/v, bowel change or blood. Past Medical History  Diagnosis Date  . History of chicken pox   . 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 Cesarean section  x's 2     2009 & 2011    reports that she has never smoked. She does not have any smokeless tobacco history on file. She reports that she drinks alcohol. She reports that she does not use illicit drugs. family history includes Arthritis in her other; Diabetes in her maternal grandmother, maternal grandmother, and paternal grandmother; Hypertension in her father. No Known Allergies Current Outpatient Prescriptions on File Prior to Visit  Medication Sig Dispense Refill  . levothyroxine (SYNTHROID, LEVOTHROID) 50 MCG tablet Take 1 tablet (50 mcg total) by mouth daily.  90 tablet  1  . loratadine (CLARITIN) 10 MG tablet Take 1 tablet (10 mg total) by mouth daily.  30 tablet  11  . omeprazole (PRILOSEC) 40 MG capsule Take 1 capsule (40 mg total) by mouth daily.  30 capsule  3   No current facility-administered medications on file prior to visit.    Review of Systems All otherwise neg per pt     Objective:   Physical Exam BP 102/60  Pulse 89  Temp(Src) 97.4 F (36.3 C) (Oral)  Ht 5\' 3"  (1.6 m)  Wt 136 lb (61.689 kg)  BMI 24.10 kg/m2  SpO2 97% VS noted,  Constitutional: Pt  appears well-developed and well-nourished.  HENT: Head: NCAT.  Right Ear: External ear normal.  Left Ear: External ear normal.  Bilat tm's with mild erythema.  Max sinus areas non tender.  Pharynx with mild erythema, no exudate Eyes: Conjunctivae and EOM are normal. Pupils are equal, round, and reactive to light.  Neck: Normal range of motion. Neck supple.  Cardiovascular: Normal rate and regular rhythm.   Pulmonary/Chest: Effort normal and breath sounds normal.  Neurological: Pt is alert. Not confused  Skin: Skin is warm. No erythema.  Psychiatric: Pt behavior is normal. Thought content normal.     Assessment & Plan:

## 2013-07-27 NOTE — Assessment & Plan Note (Signed)
Stable, to cont PPI

## 2013-07-27 NOTE — Assessment & Plan Note (Signed)
Ok for otc allegra prn 

## 2013-07-27 NOTE — Assessment & Plan Note (Signed)
Mild to mod, for antibx course,  to f/u any worsening symptoms or concerns 

## 2014-01-31 IMAGING — CR DG CHEST 2V
2 series · 2 of 2 positions shown · non-contrast
Comparison: None.

CLINICAL DATA: Cough, shortness of breath, wheezing

CHEST - 2 VIEW

[view not recorded (1 of 2)]
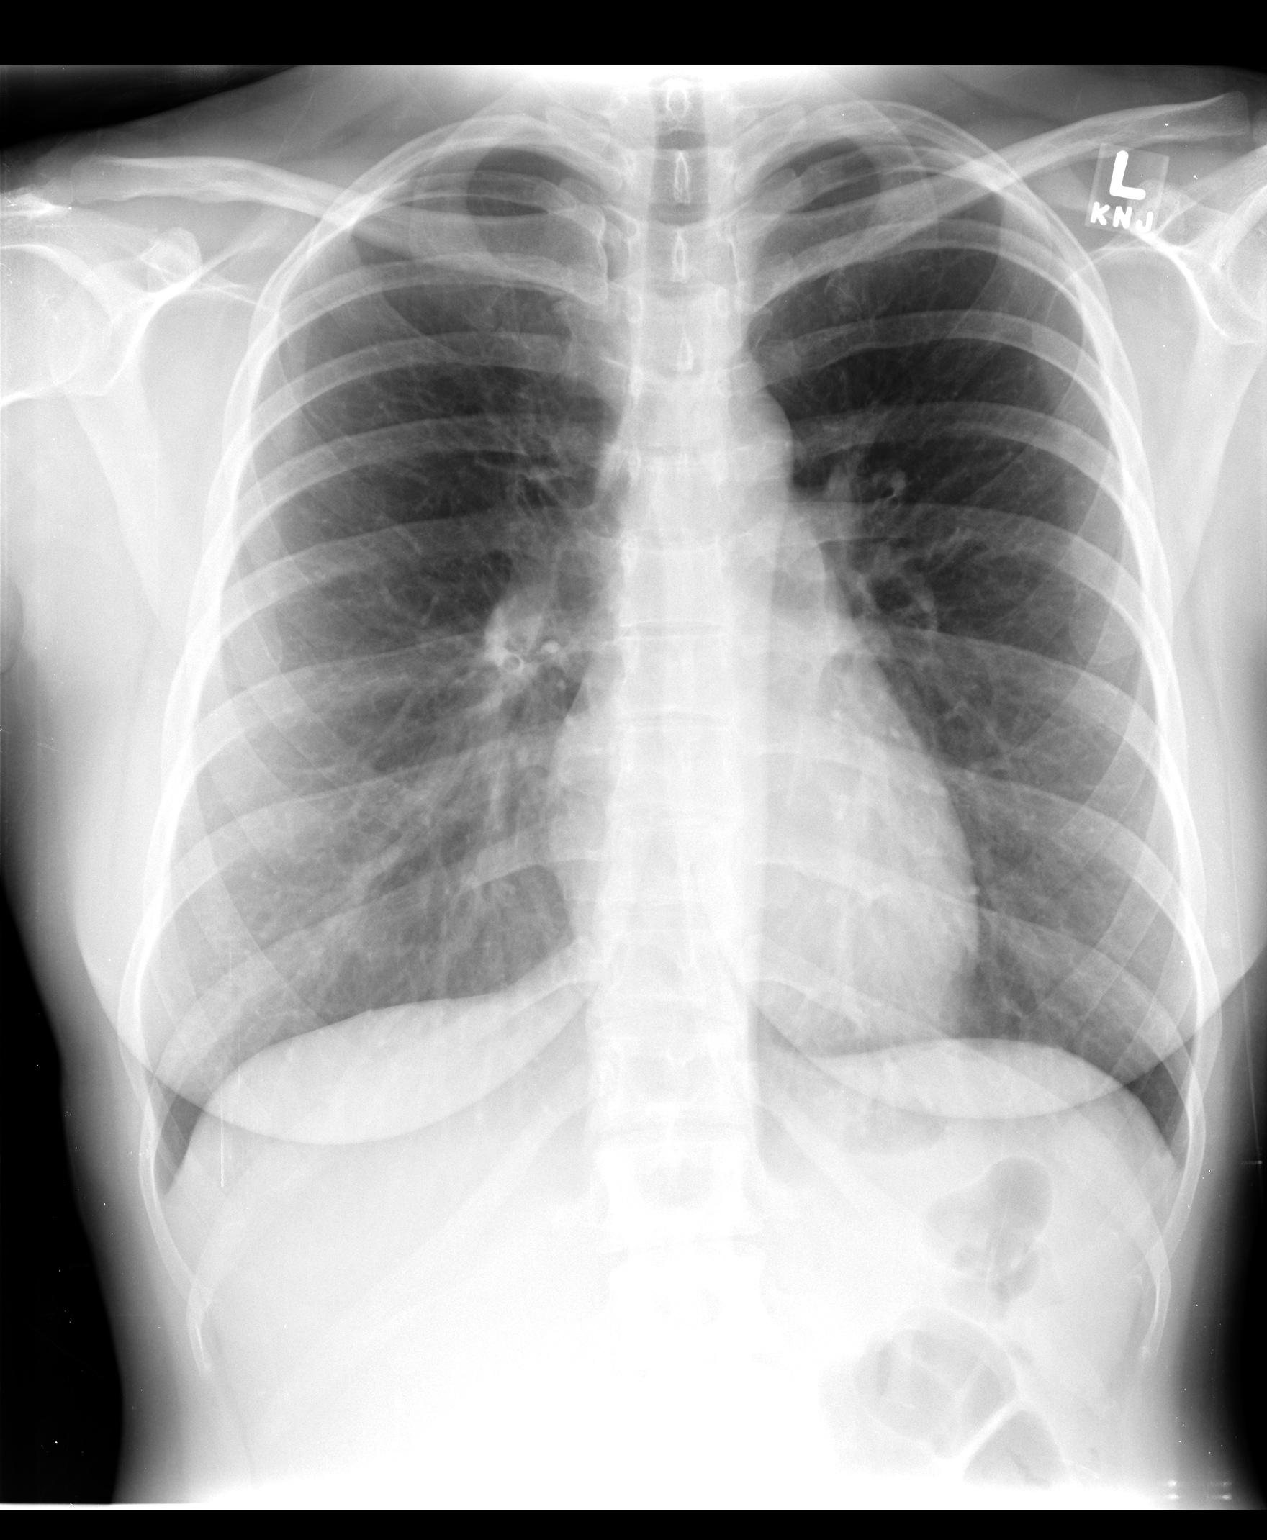

[view not recorded (2 of 2)]
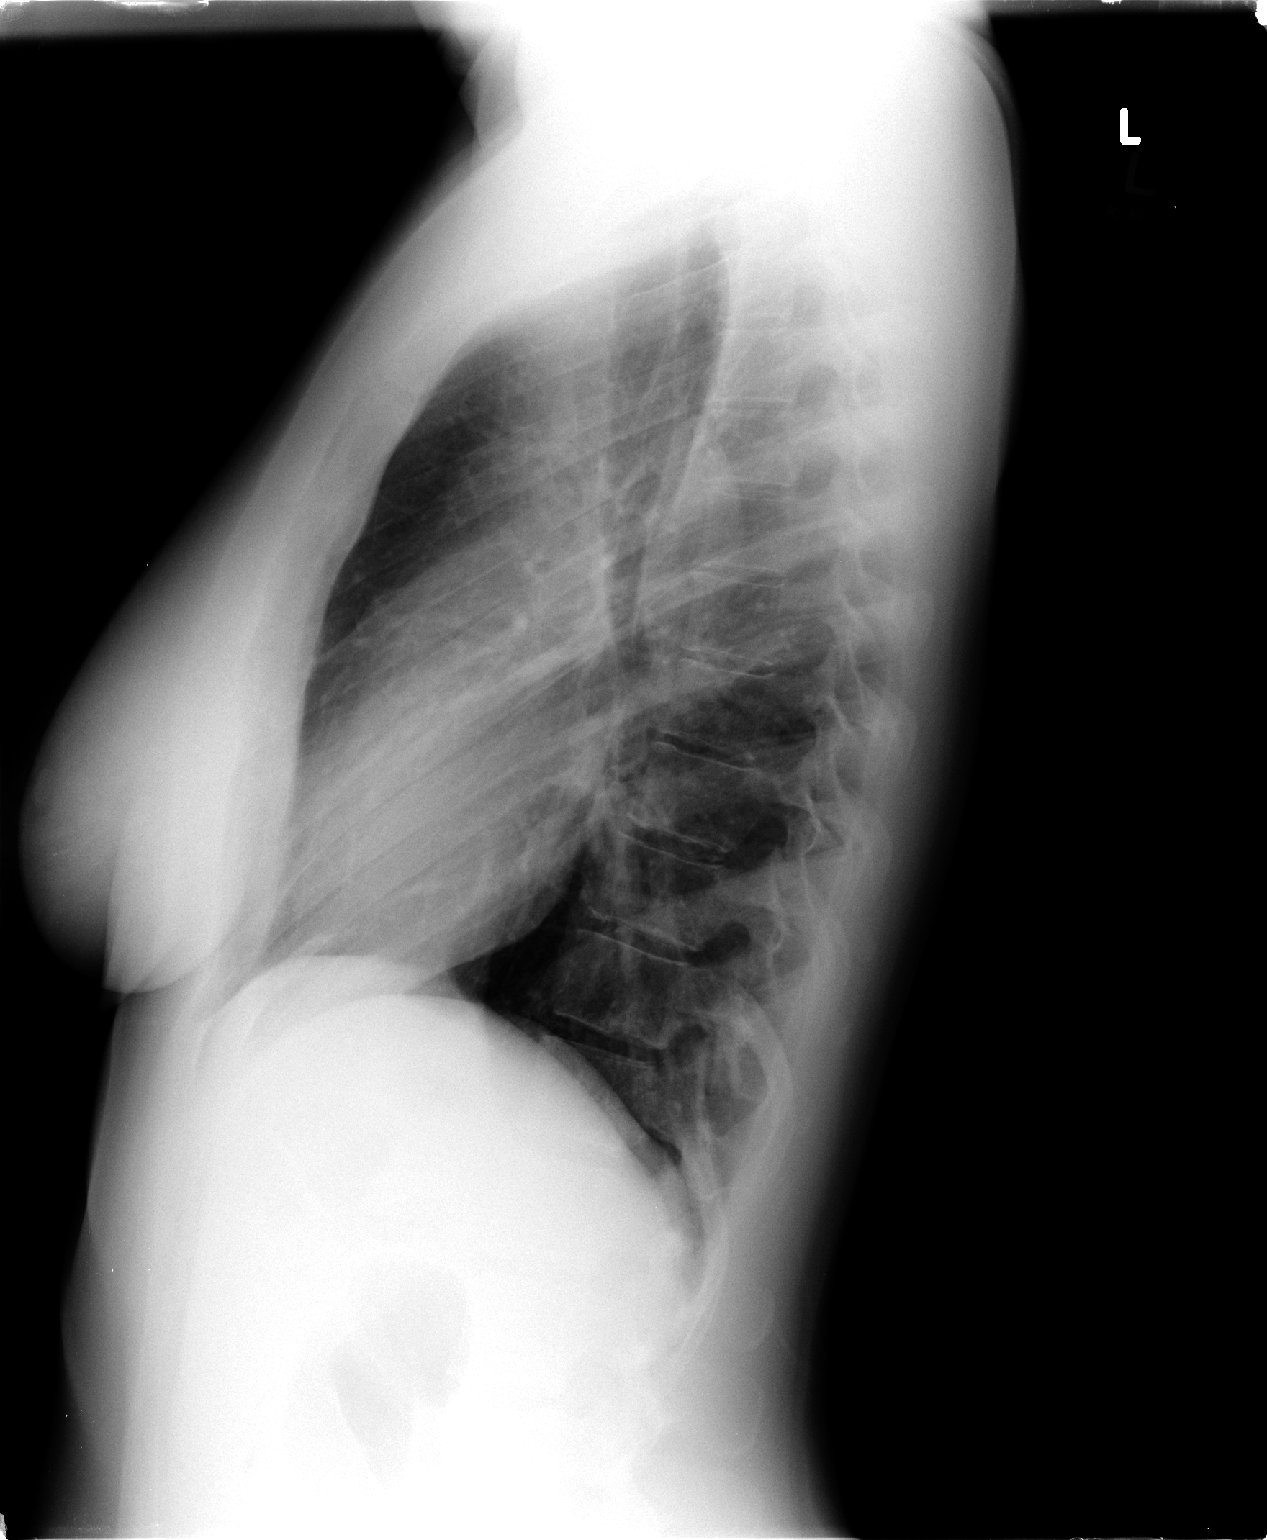

[2 of 2 positions shown; findings below may reference images not displayed]

FINDINGS: Lungs are clear. No pleural effusion or pneumothorax.

Cardiomediastinal silhouette is within normal limits.

Visualized osseous structures are within normal limits.
IMPRESSION: Normal chest radiographs.

## 2014-02-07 IMAGING — US US SOFT TISSUE HEAD/NECK
1 series · 14 of 25 positions shown · non-contrast
Comparison: 12/15/2011 and 08/31/2010.

CLINICAL DATA: Follow-up goiter.

THYROID ULTRASOUND
TECHNIQUE: Ultrasound examination of the thyroid gland and adjacent
soft tissues was performed.

[Series 1: us soft tissue head/neck · 0.09mm/px · 14 of 56 slices shown]
[im 1/56]
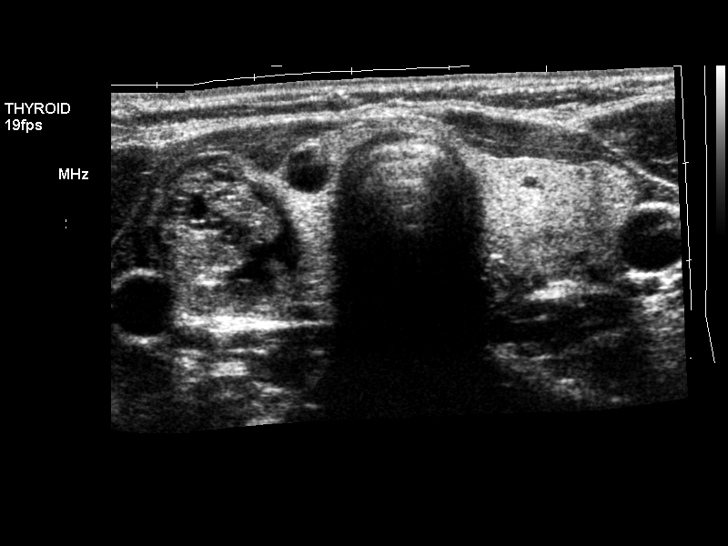
[im 5/56]
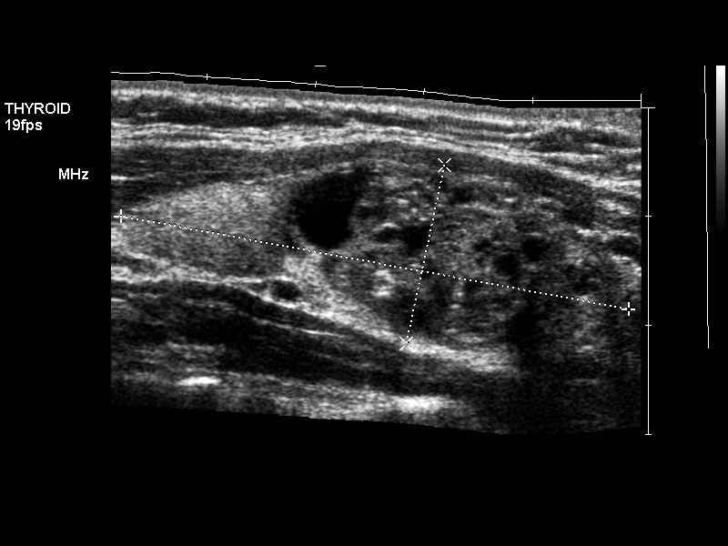
[im 10/56]
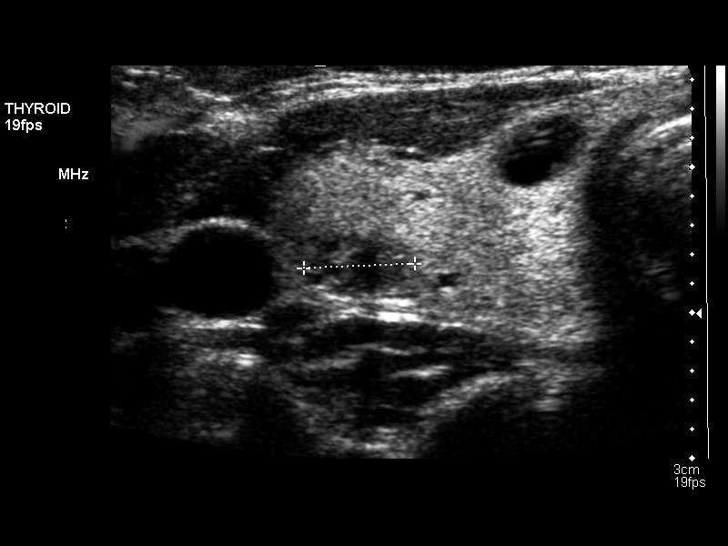
[im 14/56]
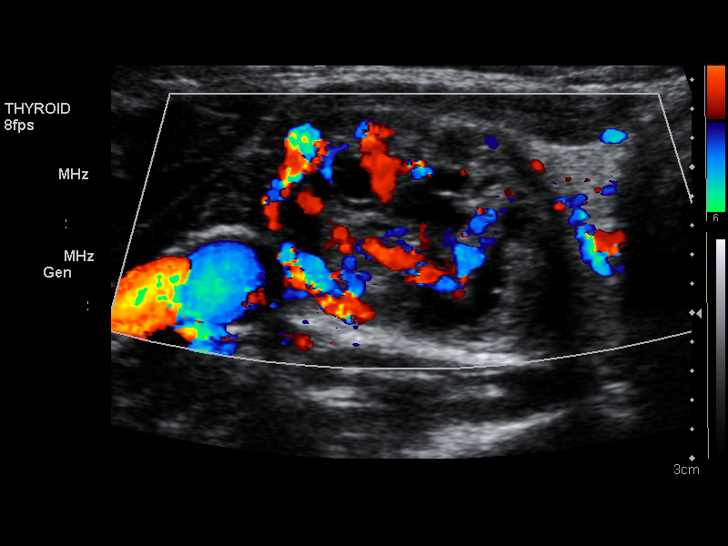
[im 19/56]
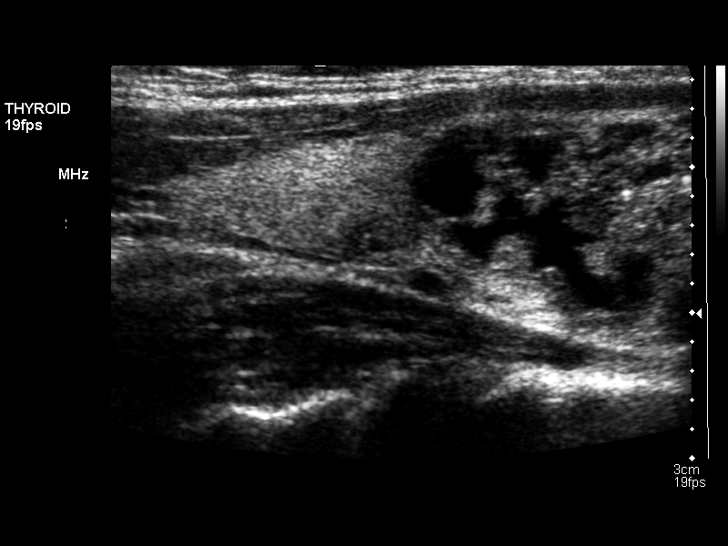
[im 21/56]
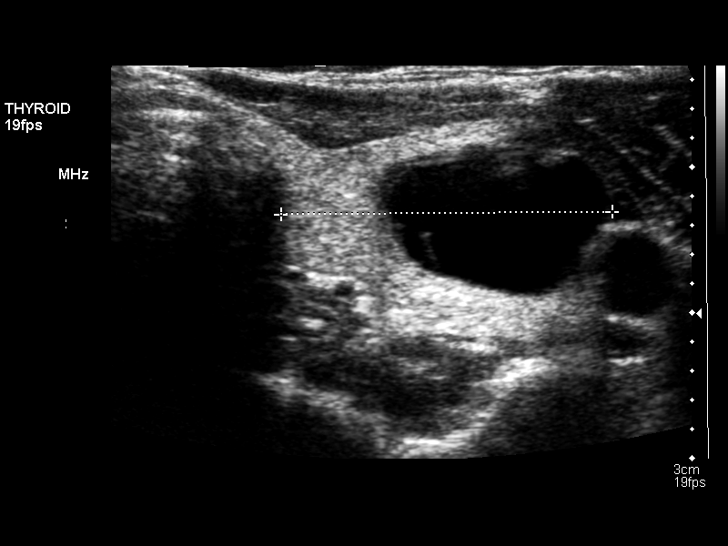
[im 26/56]
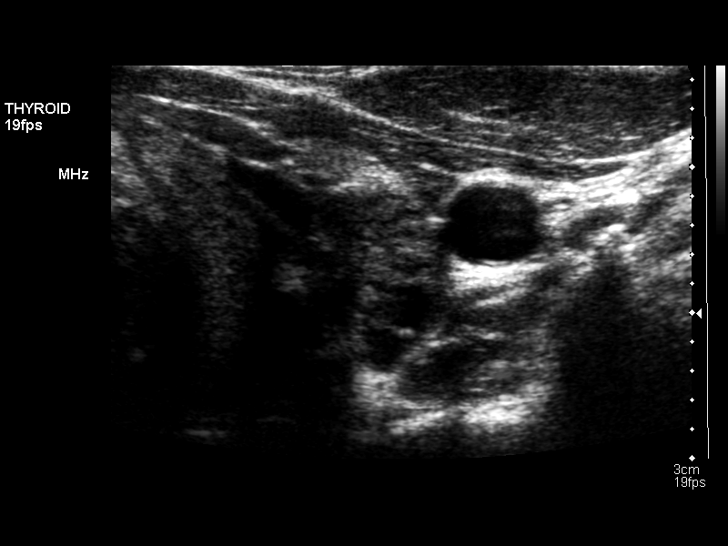
[im 30/56]
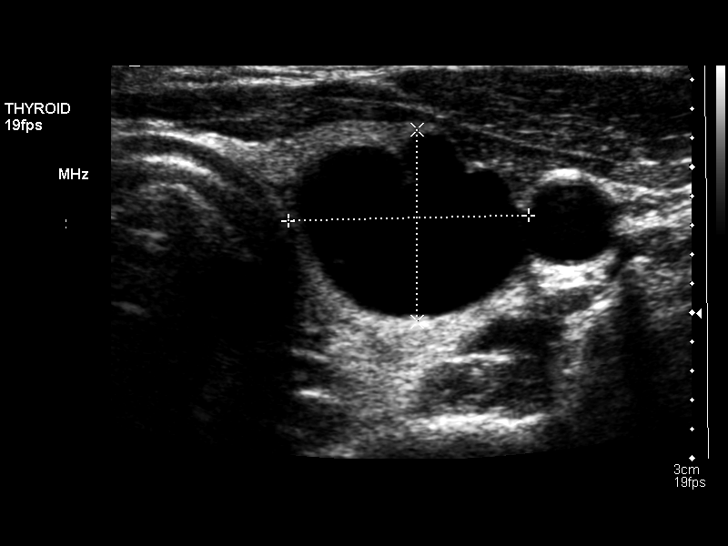
[im 35/56]
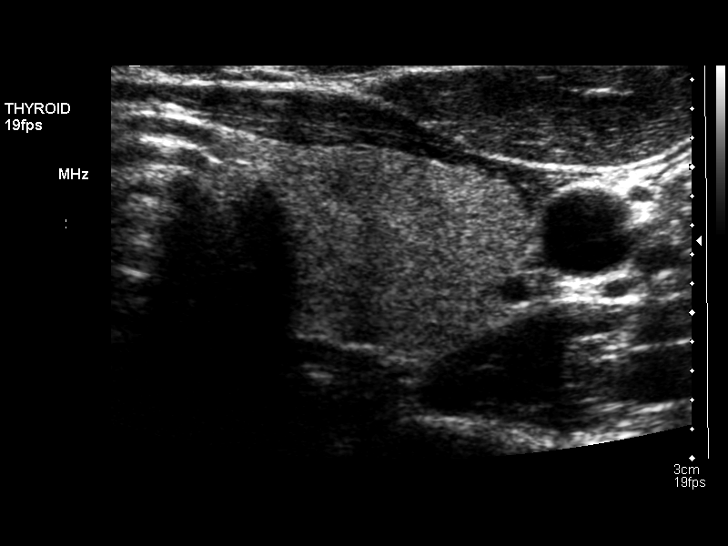
[im 37/56]
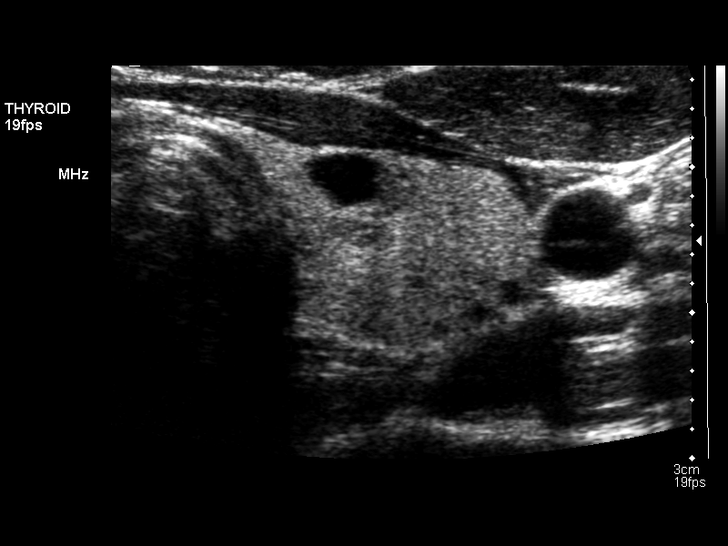
[im 42/56]
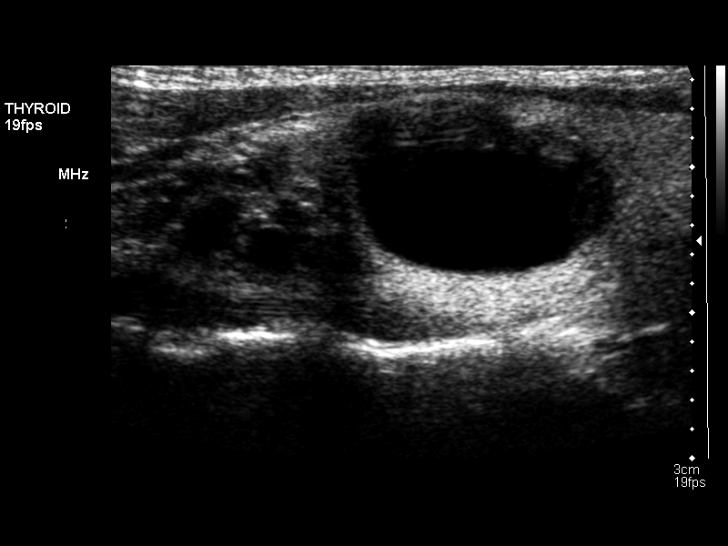
[im 46/56]
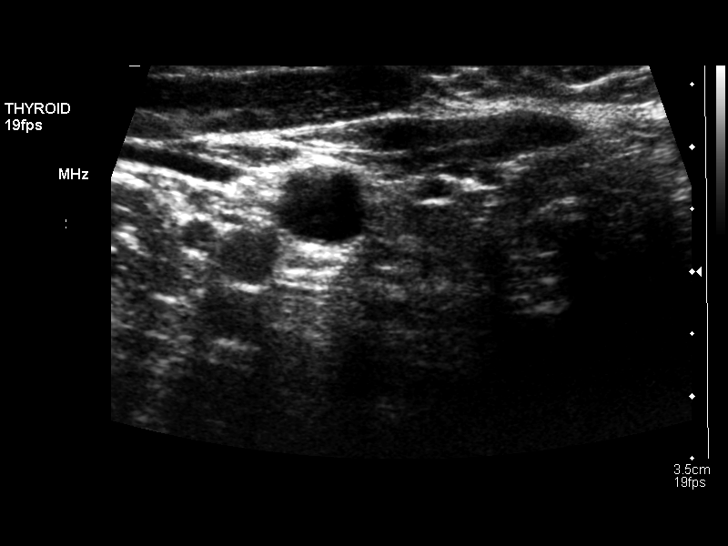
[im 51/56]
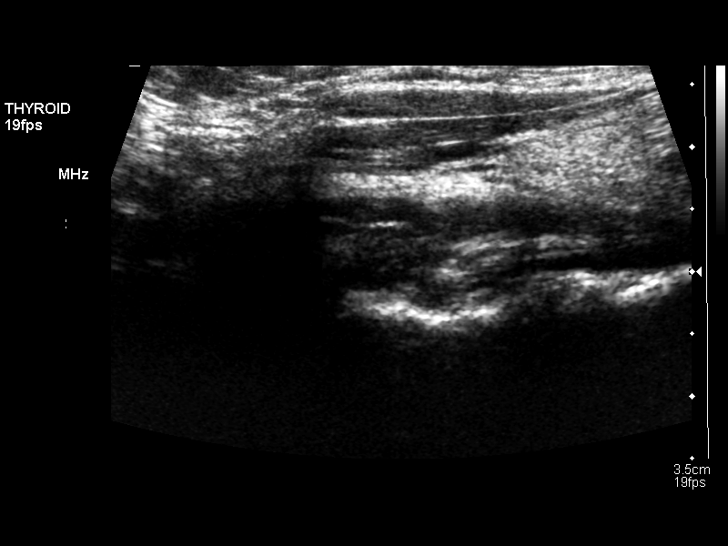
[im 56/56]
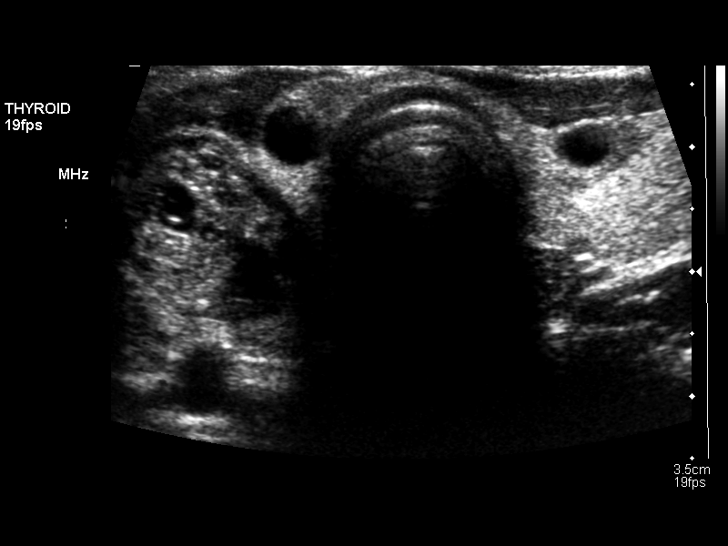

[14 of 25 positions shown; findings below may reference images not displayed]

FINDINGS: Right thyroid lobe:  Measures 4.7 x 1.7 x 2.2 cm (previously 4.8 x
1.8 x 2.2 cm), heterogeneous in echotexture.
Left thyroid lobe:  Measures 5.0 x 1.8 x 2.3 cm (previously 5.3 x
1.7 x 2.3 cm), heterogeneous in echotexture.
Isthmus:  Measures 2 mm.

Focal nodules:  There are hypoechoic, complex and solid nodules
bilaterally.

Dominant complex cystic and solid nodule, right mid pole, 2.2 x
x 2.9 cm (image 13), previously 2.5 x 1.5 x 1.8 cm on 12/15/2011
and 2.9 x 1.7 x 2.1 cm on 08/31/2010.

A mostly cystic and minimally complex nodule, left mid pole, 1.7 x
1.3 x 1.9 cm (image 31) , previously 2.2 x 1.3 x 2.0 cm.

Additional nodules measure 1 cm or less in size bilaterally.

Lymphadenopathy:  None visualized.
IMPRESSION: Bilateral thyroid nodules of varying complexity.  Dominant
bilateral nodules are stable.

## 2014-06-09 ENCOUNTER — Ambulatory Visit (INDEPENDENT_AMBULATORY_CARE_PROVIDER_SITE_OTHER): Payer: BC Managed Care – PPO | Admitting: Physician Assistant

## 2014-06-09 ENCOUNTER — Encounter: Payer: Self-pay | Admitting: Physician Assistant

## 2014-06-09 VITALS — BP 98/60 | HR 60 | Temp 98.5°F | Resp 18 | Wt 143.2 lb

## 2014-06-09 DIAGNOSIS — J019 Acute sinusitis, unspecified: Secondary | ICD-10-CM

## 2014-06-09 MED ORDER — AMOXICILLIN-POT CLAVULANATE 875-125 MG PO TABS: 1.0000 | ORAL_TABLET | Freq: Two times a day (BID) | ORAL | Status: DC

## 2014-06-09 MED ORDER — HYDROCODONE-HOMATROPINE 5-1.5 MG/5ML PO SYRP: 5.0000 mL | ORAL_SOLUTION | Freq: Every evening | ORAL | Status: DC | PRN

## 2014-06-09 NOTE — Progress Notes (Signed)
Pre visit review using our clinic review tool, if applicable. No additional management support is needed unless otherwise documented below in the visit note. 

## 2014-06-09 NOTE — Patient Instructions (Addendum)
Hycodan at night for cough. DO NOT DRIVE WHILE TAKING THIS MEDICATION AS IT WILL INHIBIT YOUR ABILITY TO OPERATE A MOTOR VEHICLE.   Plain Over the Counter Mucinex (NOT Mucinex D if also using Sudafed) for thick secretions  You can continue using Sudafed for congestion symptoms.  Force NON dairy fluids, drinking plenty of water is best.    Over the Counter Flonase OR Nasacort AQ 1 spray in each nostril twice a day as needed. Use the "crossover" technique into opposite nostril spraying toward opposite ear @ 45 degree angle, not straight up into nostril.   Plain Over the Counter Allegra (NOT D )  160 daily , OR Loratidine 10 mg , OR Zyrtec 10 mg @ bedtime  as needed for itchy eyes & sneezing.  Saline Irrigation and Saline Sprays can also help reduce symptoms.  If you don't see any improvement despite using the above, you can fill the prescription for Augmentin. This will be twice daily for 10 days.  If emergency symptoms discussed during visit developed, seek medical attention immediately.  Followup as needed, or for worsening or persistent symptoms despite treatment.   Sinusitis Sinusitis is redness, soreness, and puffiness (inflammation) of the air pockets in the bones of your face (sinuses). The redness, soreness, and puffiness can cause air and mucus to get trapped in your sinuses. This can allow germs to grow and cause an infection.  HOME CARE   Drink enough fluids to keep your pee (urine) clear or pale yellow.  Use a humidifier in your home.  Run a hot shower to create steam in the bathroom. Sit in the bathroom with the door closed. Breathe in the steam 3-4 times a day.  Put a warm, moist washcloth on your face 3-4 times a day, or as told by your doctor.  Use salt water sprays (saline sprays) to wet the thick fluid in your nose. This can help the sinuses drain.  Only take medicine as told by your doctor. GET HELP RIGHT AWAY IF:   Your pain gets worse.  You have very bad  headaches.  You are sick to your stomach (nauseous).  You throw up (vomit).  You are very sleepy (drowsy) all the time.  Your face is puffy (swollen).  Your vision changes.  You have a stiff neck.  You have trouble breathing. MAKE SURE YOU:   Understand these instructions.  Will watch your condition.  Will get help right away if you are not doing well or get worse. Document Released: 02/08/2008 Document Revised: 05/16/2012 Document Reviewed: 03/27/2012 [facility] Patient Information 2015 [employer] [place], [place]. This information is not intended to replace advice given to you by your health care provider. Make sure you discuss any questions you have with your health care provider.

## 2014-06-09 NOTE — Progress Notes (Signed)
Subjective:    Patient ID: Carrie Oconnell, female    DOB: 1980-09-11, 33 y.o.   MRN: [id number]  Sinusitis This is a new problem. The current episode started in the past 7 days (6 days). The problem is unchanged. There has been no fever. Her pain is at a severity of 6/10. Associated symptoms include chills, congestion, coughing (sputum), ear pain, headaches, a hoarse voice, sinus pressure and a sore throat. Pertinent negatives include no diaphoresis, neck pain, shortness of breath, sneezing or swollen glands. Treatments tried: Sudafed. The treatment provided mild relief.      Review of Systems  Constitutional: Positive for chills. Negative for fever and diaphoresis.  HENT: Positive for congestion, ear pain, hoarse voice, postnasal drip, sinus pressure and sore throat. Negative for sneezing.   Respiratory: Positive for cough (sputum). Negative for shortness of breath.   Cardiovascular: Negative for chest pain.  Gastrointestinal: Negative for nausea, vomiting and diarrhea.  Musculoskeletal: Negative for neck pain.  Neurological: Positive for headaches. Negative for syncope.  All other systems reviewed and are negative.   Past Medical History  Diagnosis Date  . History of chicken pox   . 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    History   Social History  . Marital Status: Married    Spouse Name: N/A    Number of Children: N/A  . Years of Education: N/A   Occupational History  . Not on file.   Social History Main Topics  . Smoking status: Never Smoker   . 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 Alcohol Use: Yes  . Drug Use: No  . Sexual Activity: Not on file   Other Topics Concern  . Not on file   Social History Narrative  . No narrative on file    Past Surgical History  Procedure Laterality Date  . Cesarean section  x's 2     2009 & 2011    Family History  Problem  Relation Age of Onset  . Arthritis Other   . Diabetes Maternal Grandmother   . Hypertension Father   . Diabetes Maternal Grandmother   . Diabetes Paternal Grandmother     No Known Allergies  Current Outpatient Prescriptions on File Prior to Visit  Medication Sig Dispense Refill  . loratadine (CLARITIN) 10 MG tablet Take 1 tablet (10 mg total) by mouth daily.  30 tablet  11  . levothyroxine (SYNTHROID, LEVOTHROID) 50 MCG tablet Take 1 tablet (50 mcg total) by mouth daily.  90 tablet  1  . omeprazole (PRILOSEC) 40 MG capsule Take 1 capsule (40 mg total) by mouth daily.  30 capsule  3   No current facility-administered medications on file prior to visit.    EXAM: BP 98/60  Pulse 60  Temp(Src) 98.5 F (36.9 C) (Oral)  Resp 18  Wt 143 lb 3.2 oz (64.955 kg)      Objective:   Physical Exam  Nursing note and vitals reviewed. Constitutional: She is oriented to person, place, and time. She appears well-developed and well-nourished. No distress.  HENT:  Head: Normocephalic and atraumatic.  Right Ear: External ear normal.  Left Ear: External ear normal.  Nose: Nose normal.  Mouth/Throat: No oropharyngeal exudate.  Oropharynx is slightly erythematous, no exudate. Bilateral TMs normal. Bilateral frontal sinuses non-TTP. Bilateral max sinuses ttp.  Eyes: Conjunctivae and EOM are normal. Pupils  are equal, round, and reactive to light.  Neck: Normal range of motion. Neck supple.  Cardiovascular: Normal rate, regular rhythm and intact distal pulses.   Pulmonary/Chest: Effort normal and breath sounds normal. No stridor. No respiratory distress. She has no wheezes. She has no rales. She exhibits no tenderness.  Lymphadenopathy:    She has no cervical adenopathy.  Neurological: She is alert and oriented to person, place, and time.  Skin: Skin is warm and dry. No rash noted. She is not diaphoretic. No erythema. No pallor.  Psychiatric: She has a normal mood and affect. Her behavior is  normal. Judgment and thought content normal.     Lab Results  Component Value Date   WBC 7.9 01/14/2013   HGB 13.9 01/14/2013   HCT 41.2 01/14/2013   PLT 201.0 01/14/2013   GLUCOSE 86 11/09/2011   ALT 18 11/09/2011   AST 17 11/09/2011   NA 140 11/09/2011   K 4.1 11/09/2011   CL 102 11/09/2011   CREATININE 0.7 11/09/2011   BUN 9 11/09/2011   CO2 31 11/09/2011   TSH 1.01 01/14/2013   INR 1.2 02/23/2008        Assessment & Plan:  Luster LandsbergRenee was seen today for sinusitis.  Diagnoses and associated orders for this visit:  Acute sinusitis, recurrence not specified, unspecified location Comments: Try symptomatic treatment with OTC mucinex, nasal steroid, antihistamine, rest, push fluids. Augmentin if no better in 2 days. Watchful waiting. - HYDROcodone-homatropine (HYCODAN) 5-1.5 MG/5ML syrup; Take 5 mLs by mouth at bedtime as needed for cough (DO NOT DRIVE WHILE TAKING THIS MEDICATION AS IT WILL INHIBIT YOUR ABILITY TO OPERATE A MOTOR VEHICLE.). - amoxicillin-clavulanate (AUGMENTIN) 875-125 MG per tablet; Take 1 tablet by mouth 2 (two) times daily.    Advised pt that antibiotics are usually not beneficial within the first 10 days of an illness, and that the majority of illnesses similar to her's are viral and self limiting. Despite this, the pt still requested antibiotics. Suggested to pt that I give her a printed Rx for Augmentin, for her to fill only if in a few days she does not feel any improvement despite symptomatic treatment. The pt is amenable to this plan.  Return precautions provided, and patient handout on sinusitis.  Plan to follow up as needed, or for worsening or persistent symptoms despite treatment.  Patient Instructions  Hycodan at night for cough. DO NOT DRIVE WHILE TAKING THIS MEDICATION AS IT WILL INHIBIT YOUR ABILITY TO OPERATE A MOTOR VEHICLE.   Plain Over the Counter Mucinex (NOT Mucinex D if also using Sudafed) for thick secretions  You can continue using Sudafed for congestion  symptoms.  Force NON dairy fluids, drinking plenty of water is best.    Over the Counter Flonase OR Nasacort AQ 1 spray in each nostril twice a day as needed. Use the "crossover" technique into opposite nostril spraying toward opposite ear @ 45 degree angle, not straight up into nostril.   Plain Over the Counter Allegra (NOT D )  160 daily , OR Loratidine 10 mg , OR Zyrtec 10 mg @ bedtime  as needed for itchy eyes & sneezing.  Saline Irrigation and Saline Sprays can also help reduce symptoms.  If you don't see any improvement despite using the above, you can fill the prescription for Augmentin. This will be twice daily for 10 days.  If emergency symptoms discussed during visit developed, seek medical attention immediately.  Followup as needed, or for worsening or persistent symptoms  despite treatment.

## 2015-08-26 ENCOUNTER — Telehealth: Payer: Self-pay | Admitting: Nurse Practitioner

## 2015-08-26 DIAGNOSIS — J0100 Acute maxillary sinusitis, unspecified: Secondary | ICD-10-CM

## 2015-08-26 MED ORDER — AMOXICILLIN-POT CLAVULANATE 875-125 MG PO TABS: 1.0000 | ORAL_TABLET | Freq: Two times a day (BID) | ORAL | Status: DC

## 2015-08-26 NOTE — Progress Notes (Signed)

## 2016-12-14 DIAGNOSIS — Z6824 Body mass index (BMI) 24.0-24.9, adult: Secondary | ICD-10-CM | POA: Diagnosis not present

## 2016-12-14 DIAGNOSIS — Z23 Encounter for immunization: Secondary | ICD-10-CM | POA: Diagnosis not present

## 2016-12-14 DIAGNOSIS — Z01419 Encounter for gynecological examination (general) (routine) without abnormal findings: Secondary | ICD-10-CM | POA: Diagnosis not present

## 2017-04-26 DIAGNOSIS — N390 Urinary tract infection, site not specified: Secondary | ICD-10-CM | POA: Diagnosis not present

## 2017-06-18 DIAGNOSIS — Z23 Encounter for immunization: Secondary | ICD-10-CM | POA: Diagnosis not present

## 2017-12-20 DIAGNOSIS — Z01419 Encounter for gynecological examination (general) (routine) without abnormal findings: Secondary | ICD-10-CM | POA: Diagnosis not present

## 2017-12-20 DIAGNOSIS — R300 Dysuria: Secondary | ICD-10-CM | POA: Diagnosis not present

## 2017-12-20 LAB — HM PAP SMEAR

## 2018-01-12 ENCOUNTER — Ambulatory Visit: Payer: Self-pay | Admitting: Nurse Practitioner

## 2018-01-12 ENCOUNTER — Encounter: Payer: Self-pay | Admitting: Physician Assistant

## 2018-01-12 ENCOUNTER — Ambulatory Visit (INDEPENDENT_AMBULATORY_CARE_PROVIDER_SITE_OTHER): Payer: 59 | Admitting: Physician Assistant

## 2018-01-12 VITALS — BP 110/66 | HR 65 | Temp 98.0°F | Ht 63.0 in | Wt 148.5 lb

## 2018-01-12 DIAGNOSIS — R5383 Other fatigue: Secondary | ICD-10-CM

## 2018-01-12 DIAGNOSIS — Z13220 Encounter for screening for lipoid disorders: Secondary | ICD-10-CM | POA: Diagnosis not present

## 2018-01-12 DIAGNOSIS — E049 Nontoxic goiter, unspecified: Secondary | ICD-10-CM

## 2018-01-12 DIAGNOSIS — Z114 Encounter for screening for human immunodeficiency virus [HIV]: Secondary | ICD-10-CM | POA: Diagnosis not present

## 2018-01-12 DIAGNOSIS — Z0001 Encounter for general adult medical examination with abnormal findings: Secondary | ICD-10-CM | POA: Diagnosis not present

## 2018-01-12 DIAGNOSIS — Z136 Encounter for screening for cardiovascular disorders: Secondary | ICD-10-CM

## 2018-01-12 LAB — CBC WITH DIFFERENTIAL/PLATELET
BASOS PCT: 0.2 % (ref 0.0–3.0)
Basophils Absolute: 0 10*3/uL (ref 0.0–0.1)
Eosinophils Absolute: 0.2 10*3/uL (ref 0.0–0.7)
Eosinophils Relative: 2.8 % (ref 0.0–5.0)
HCT: 45 % (ref 36.0–46.0)
Hemoglobin: 15.2 g/dL — ABNORMAL HIGH (ref 12.0–15.0)
LYMPHS ABS: 2.1 10*3/uL (ref 0.7–4.0)
Lymphocytes Relative: 27.1 % (ref 12.0–46.0)
MCHC: 33.9 g/dL (ref 30.0–36.0)
MCV: 92.4 fl (ref 78.0–100.0)
MONOS PCT: 9.1 % (ref 3.0–12.0)
Monocytes Absolute: 0.7 10*3/uL (ref 0.1–1.0)
NEUTROS PCT: 60.8 % (ref 43.0–77.0)
Neutro Abs: 4.6 10*3/uL (ref 1.4–7.7)
PLATELETS: 247 10*3/uL (ref 150.0–400.0)
RBC: 4.86 Mil/uL (ref 3.87–5.11)
RDW: 13.1 % (ref 11.5–15.5)
WBC: 7.6 10*3/uL (ref 4.0–10.5)

## 2018-01-12 LAB — LIPID PANEL
Cholesterol: 147 mg/dL (ref 0–200)
HDL: 50.2 mg/dL (ref >39.00)
LDL Cholesterol: 86 mg/dL (ref 0–99)
NONHDL: 96.4
Total CHOL/HDL Ratio: 3
Triglycerides: 52 mg/dL (ref 0.0–149.0)
VLDL: 10.4 mg/dL (ref 0.0–40.0)

## 2018-01-12 LAB — COMPREHENSIVE METABOLIC PANEL
ALK PHOS: 58 U/L (ref 39–117)
ALT: 17 U/L (ref 0–35)
AST: 16 U/L (ref 0–37)
Albumin: 4.5 g/dL (ref 3.5–5.2)
BUN: 9 mg/dL (ref 6–23)
CO2: 29 mEq/L (ref 19–32)
Calcium: 9.6 mg/dL (ref 8.4–10.5)
Chloride: 104 mEq/L (ref 96–112)
Creatinine, Ser: 0.71 mg/dL (ref 0.40–1.20)
GFR: 99.06 mL/min (ref >60.00)
GLUCOSE: 86 mg/dL (ref 70–99)
POTASSIUM: 4.9 meq/L (ref 3.5–5.1)
Sodium: 140 mEq/L (ref 135–145)
TOTAL PROTEIN: 7.3 g/dL (ref 6.0–8.3)
Total Bilirubin: 0.6 mg/dL (ref 0.2–1.2)

## 2018-01-12 LAB — T4, FREE: Free T4: 0.7 ng/dL (ref 0.60–1.60)

## 2018-01-12 LAB — TSH: TSH: 0.64 u[IU]/mL (ref 0.35–4.50)

## 2018-01-12 NOTE — Progress Notes (Signed)
Carrie Oconnell is a 37 y.o. female here to Establish Care.  I acted as a Neurosurgeon for [employer], PA-C Corky Mull, LPN  History of Present Illness:   Chief Complaint  Patient presents with  . Establish Care    [employer]  . Annual Exam    Acute Concerns: Fatigue and history of thyroid nodules -- had a thyroid biopsy 2012 (normal), u/s 2014 (normal) --she has had no follow-up since.  She is currently with fatigue, feels like it is more pronounced. Denies palpitations, constipation, significant weight changes, changes in hair skin, diarrhea.  She was seeing an endocrinologist briefly at [facility] for this, but has not seen one in several years. Denies depression, does have stress with job.  Health Maintenance: Immunizations -- UTD PAP -- done 12/2017 Normal per pt, will obtain records Diet -- "fairly healthy"; eats out often; feels like meals are well balanced Caffeine intake -- 3-4 cups coffee daily Sleep habits -- 6-7.5 hours a night; has started snoring -- no concern for apnea Exercise -- none scheduled Weight -- Weight: 148 lb 8 oz (67.4 kg)  -- 130-135 lb is where she is more comfortable, was here around 2 years ago Mood -- does have stress with job -- currently looking for a new job Periods -- does not get  Depression screen PHQ 2/9 01/12/2018  Decreased Interest 0  Down, Depressed, Hopeless 0  PHQ - 2 Score 0    No flowsheet data found.   Other providers/specialists: Therapist, [facility] Dentist - Juanetta Gosling   Past Medical History:  Diagnosis Date  . Blood transfusion without reported diagnosis    2009 with first delivery  . History of chicken pox   . Thyroid nodule    s/p bx 10/2010>benign non neoplastic goiter     Social History   Socioeconomic History  . Marital status: Married    Spouse name: Not on file  . Number of children: Not on file  . Years of education: Not on file  . Highest education level: Not on file  Occupational History  .  Not on file  Social Needs  . Financial resource strain: Not on file  . Food insecurity:    Worry: Not on file    Inability: Not on file  . Transportation needs:    Medical: Not on file    Non-medical: Not on file  Tobacco Use  . Smoking status: Never Smoker  . Smokeless tobacco: Never Used  . 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 Alcohol use: Yes    Comment: Socially  . Drug use: No  . Sexual activity: Yes    Birth control/protection: IUD    Comment: MIrena  Lifestyle  . Physical activity:    Days per week: Not on file    Minutes per session: Not on file  . Stress: Not on file  Relationships  . Social connections:    Talks on phone: Not on file    Gets together: Not on file    Attends religious service: Not on file    Active member of club or organization: Not on file    Attends meetings of clubs or organizations: Not on file    Relationship status: Not on file  . Intimate partner violence:    Fear of current or ex partner: Not on file    Emotionally abused: Not on file    Physically  abused: Not on file    Forced sexual activity: Not on file  Other Topics Concern  . Not on file  Social History Narrative   Works in [employer]   Married   9 and 37 y/o    Past Surgical History:  Procedure Laterality Date  . CESAREAN SECTION  x's 2    2009 & 2011  . WISDOM TOOTH EXTRACTION Bilateral 1998    Family History  Problem Relation Age of Onset  . Arthritis Other   . Diabetes Maternal Grandmother   . Hypertension Father   . Diabetes Paternal Grandmother   . Colon cancer Paternal Grandmother   . Hypercholesterolemia Mother   . Prostate cancer Maternal Grandfather   . Breast cancer Neg Hx     No Known Allergies   Current Medications:   Current Outpatient Medications:  .  ibuprofen (ADVIL) 200 MG tablet, Take 400 mg by mouth as needed., Disp: , Rfl:  .  levonorgestrel (MIRENA) 20 MCG/24HR  IUD, 1 each by Intrauterine route once. Inserted on 2015 by GYN, needs to be removed in 2020., Disp: , Rfl:    Review of Systems:   Review of Systems  Constitutional: Positive for malaise/fatigue. Negative for chills, fever and weight loss.  HENT: Negative.  Negative for hearing loss, sinus pain and sore throat.   Eyes: Negative.  Negative for blurred vision.  Respiratory: Negative.  Negative for cough and shortness of breath.   Cardiovascular: Negative.  Negative for chest pain, palpitations and leg swelling.  Gastrointestinal: Negative.  Negative for abdominal pain, constipation, diarrhea, heartburn, nausea and vomiting.  Genitourinary: Negative.  Negative for dysuria, frequency and urgency.  Musculoskeletal: Negative.  Negative for back pain, myalgias and neck pain.  Skin: Negative.  Negative for itching and rash.  Neurological: Negative.  Negative for dizziness, tingling, seizures, loss of consciousness and headaches.  Endo/Heme/Allergies: Negative.  Negative for polydipsia.  Psychiatric/Behavioral: Negative.  Negative for depression. The patient is not nervous/anxious.     Vitals:   Vitals:   01/12/18 0915  BP: 110/66  Pulse: 65  Temp: 98 F (36.7 C)  TempSrc: Oral  SpO2: 99%  Weight: 148 lb 8 oz (67.4 kg)  Height:  (1.6 m)     Body mass index is 26.31 kg/m.  Physical Exam:   Physical Exam  Constitutional: She appears well-developed and well-nourished. She is cooperative.  Non-toxic appearance. She does not have a sickly appearance. She does not appear ill. No distress.  HENT:  Head: Normocephalic and atraumatic.  Right Ear: Tympanic membrane, external ear and ear canal normal. Tympanic membrane is not erythematous, not retracted and not bulging.  Left Ear: Tympanic membrane, external ear and ear canal normal. Tympanic membrane is not erythematous, not retracted and not bulging.  Eyes: Pupils are equal, round, and reactive to light. Conjunctivae, EOM and lids are  normal.  Neck: Trachea normal and full passive range of motion without pain.  Cardiovascular: Normal rate, regular rhythm, S1 normal, S2 normal, normal heart sounds and intact distal pulses.  Pulmonary/Chest: Effort normal and breath sounds normal. No tachypnea. No respiratory distress. She has no decreased breath sounds. She has no wheezes. She has no rhonchi. She has no rales.  Abdominal: Soft. Normal appearance and bowel sounds are normal. There is no tenderness.  Genitourinary: Cervix exhibits no motion tenderness, no discharge and no friability. Right adnexum displays no mass and no tenderness. Left adnexum displays no mass and no tenderness.  Musculoskeletal: Normal range of motion.  Lymphadenopathy:    She has no cervical adenopathy.  Neurological: She is alert. She has normal reflexes. No cranial nerve deficit or sensory deficit. GCS eye subscore is 4. GCS verbal subscore is 5. GCS motor subscore is 6.  Skin: Skin is warm, dry and intact.  Psychiatric: She has a normal mood and affect. Her speech is normal and behavior is normal.  Nursing note and vitals reviewed.   Assessment and Plan:    Deshay was seen today for establish care and annual exam.  Diagnoses and all orders for this visit:  Encounter for general adult medical examination with abnormal findings Today patient counseled on age appropriate routine health concerns for screening and prevention, each reviewed and up to date or declined. Immunizations reviewed and up to date or declined. Labs ordered and reviewed. Risk factors for depression reviewed and negative. Hearing function and visual acuity are intact. ADLs screened and addressed as needed. Functional ability and level of safety reviewed and appropriate. Education, counseling and referrals performed based on assessed risks today. Patient provided with a copy of personalized plan for preventive services.  Screening for HIV (human immunodeficiency virus) -     HIV  antibody  Encounter for lipid screening for cardiovascular disease -     Lipid panel  Fatigue, unspecified type Multifactorial.  Likely secondary to decreased physical activity, diet, and stress with job.  We will rule out organic etiology with labs today.  Follow-up if symptoms worsen or persist. -     CBC with Differential/Platelet -     Comprehensive metabolic panel -     TSH -     T4, free  Thyroid goiter I would like to order an ultrasound to follow-up on this, given patient report of subjective increase in size of goiter over the past couple years.  If it is abnormal, will refer to endocrinology for further evaluation and treatment.  We will also check thyroid labs today. -     TSH -     T4, free   Patient dropped off paperwork to be completed for her insurance company.  We will fax this once we have all the lab work that is requested on it today.  . Reviewed expectations re: course of current medical issues. . Discussed self-management of symptoms. . Outlined signs and symptoms indicating need for more acute intervention. . Patient verbalized understanding and all questions were answered. . See orders for this visit as documented in the electronic medical record. . Patient received an After-Visit Summary.   CMA or LPN served as scribe during this visit. History, Physical, and Plan performed by medical provider. Documentation and orders reviewed and attested to.   Jarold Motto, PA-C

## 2018-01-12 NOTE — Patient Instructions (Signed)
It was great to meet you!  We will contact you with your lab results.  You will be contacted for your ultrasound.  Health Maintenance, Female Adopting a healthy lifestyle and getting preventive care can go a long way to promote health and wellness. Talk with your health care provider about what schedule of regular examinations is right for you. This is a good chance for you to check in with your provider about disease prevention and staying healthy. In between checkups, there are plenty of things you can do on your own. Experts have done a lot of research about which lifestyle changes and preventive measures are most likely to keep you healthy. Ask your health care provider for more information. Weight and diet Eat a healthy diet  Be sure to include plenty of vegetables, fruits, low-fat dairy products, and lean protein.  Do not eat a lot of foods high in solid fats, added sugars, or salt.  Get regular exercise. This is one of the most important things you can do for your health. ? Most adults should exercise for at least 150 minutes each week. The exercise should increase your heart rate and make you sweat (moderate-intensity exercise). ? Most adults should also do strengthening exercises at least twice a week. This is in addition to the moderate-intensity exercise.  Maintain a healthy weight  Body mass index (BMI) is a measurement that can be used to identify possible weight problems. It estimates body fat based on height and weight. Your health care provider can help determine your BMI and help you achieve or maintain a healthy weight.  For females 31 years of age and older: ? A BMI below 18.5 is considered underweight. ? A BMI of 18.5 to 24.9 is normal. ? A BMI of 25 to 29.9 is considered overweight. ? A BMI of 30 and above is considered obese.  Watch levels of cholesterol and blood lipids  You should start having your blood tested for lipids and cholesterol at 36 years of age, then  have this test every 5 years.  You may need to have your cholesterol levels checked more often if: ? Your lipid or cholesterol levels are high. ? You are older than 36 years of age. ? You are at high risk for heart disease.  Cancer screening Lung Cancer  Lung cancer screening is recommended for adults 4-65 years old who are at high risk for lung cancer because of a history of smoking.  A yearly low-dose CT scan of the lungs is recommended for people who: ? Currently smoke. ? Have quit within the past 15 years. ? Have at least a 30-pack-year history of smoking. A pack year is smoking an average of one pack of cigarettes a day for 1 year.  Yearly screening should continue until it has been 15 years since you quit.  Yearly screening should stop if you develop a health problem that would prevent you from having lung cancer treatment.  Breast Cancer  Practice breast self-awareness. This means understanding how your breasts normally appear and feel.  It also means doing regular breast self-exams. Let your health care provider know about any changes, no matter how small.  If you are in your 20s or 30s, you should have a clinical breast exam (CBE) by a health care provider every 1-3 years as part of a regular health exam.  If you are 55 or older, have a CBE every year. Also consider having a breast X-ray (mammogram) every year.  If  you have a family history of breast cancer, talk to your health care provider about genetic screening.  If you are at high risk for breast cancer, talk to your health care provider about having an MRI and a mammogram every year.  Breast cancer gene (BRCA) assessment is recommended for women who have family members with BRCA-related cancers. BRCA-related cancers include: ? Breast. ? Ovarian. ? Tubal. ? Peritoneal cancers.  Results of the assessment will determine the need for genetic counseling and BRCA1 and BRCA2 testing.  Cervical Cancer Your health  care provider may recommend that you be screened regularly for cancer of the pelvic organs (ovaries, uterus, and vagina). This screening involves a pelvic examination, including checking for microscopic changes to the surface of your cervix (Pap test). You may be encouraged to have this screening done every 3 years, beginning at age 35.  For women ages 36-65, health care providers may recommend pelvic exams and Pap testing every 3 years, or they may recommend the Pap and pelvic exam, combined with testing for human papilloma virus (HPV), every 5 years. Some types of HPV increase your risk of cervical cancer. Testing for HPV may also be done on women of any age with unclear Pap test results.  Other health care providers may not recommend any screening for nonpregnant women who are considered low risk for pelvic cancer and who do not have symptoms. Ask your health care provider if a screening pelvic exam is right for you.  If you have had past treatment for cervical cancer or a condition that could lead to cancer, you need Pap tests and screening for cancer for at least 20 years after your treatment. If Pap tests have been discontinued, your risk factors (such as having a new sexual partner) need to be reassessed to determine if screening should resume. Some women have medical problems that increase the chance of getting cervical cancer. In these cases, your health care provider may recommend more frequent screening and Pap tests.  Colorectal Cancer  This type of cancer can be detected and often prevented.  Routine colorectal cancer screening usually begins at 37 years of age and continues through 36 years of age.  Your health care provider may recommend screening at an earlier age if you have risk factors for colon cancer.  Your health care provider may also recommend using home test kits to check for hidden blood in the stool.  A small camera at the end of a tube can be used to examine your colon  directly (sigmoidoscopy or colonoscopy). This is done to check for the earliest forms of colorectal cancer.  Routine screening usually begins at age 53.  Direct examination of the colon should be repeated every 5-10 years through 36 years of age. However, you may need to be screened more often if early forms of precancerous polyps or small growths are found.  Skin Cancer  Check your skin from head to toe regularly.  Tell your health care provider about any new moles or changes in moles, especially if there is a change in a mole's shape or color.  Also tell your health care provider if you have a mole that is larger than the size of a pencil eraser.  Always use sunscreen. Apply sunscreen liberally and repeatedly throughout the day.  Protect yourself by wearing long sleeves, pants, a wide-brimmed hat, and sunglasses whenever you are outside.  Heart disease, diabetes, and high blood pressure  High blood pressure causes heart disease and increases  the risk of stroke. High blood pressure is more likely to develop in: ? People who have blood pressure in the high end of the normal range (130-139/85-89 mm Hg). ? People who are overweight or obese. ? People who are African American.  If you are 72-28 years of age, have your blood pressure checked every 3-5 years. If you are 1 years of age or older, have your blood pressure checked every year. You should have your blood pressure measured twice-once when you are at a hospital or clinic, and once when you are not at a hospital or clinic. Record the average of the two measurements. To check your blood pressure when you are not at a hospital or clinic, you can use: ? An automated blood pressure machine at a pharmacy. ? A home blood pressure monitor.  If you are between 59 years and 56 years old, ask your health care provider if you should take aspirin to prevent strokes.  Have regular diabetes screenings. This involves taking a blood sample to  check your fasting blood sugar level. ? If you are at a normal weight and have a low risk for diabetes, have this test once every three years after 36 years of age. ? If you are overweight and have a high risk for diabetes, consider being tested at a younger age or more often. Preventing infection Hepatitis B  If you have a higher risk for hepatitis B, you should be screened for this virus. You are considered at high risk for hepatitis B if: ? You were born in a country where hepatitis B is common. Ask your health care provider which countries are considered high risk. ? Your parents were born in a high-risk country, and you have not been immunized against hepatitis B (hepatitis B vaccine). ? You have HIV or AIDS. ? You use needles to inject street drugs. ? You live with someone who has hepatitis B. ? You have had sex with someone who has hepatitis B. ? You get hemodialysis treatment. ? You take certain medicines for conditions, including cancer, organ transplantation, and autoimmune conditions.  Hepatitis C  Blood testing is recommended for: ? Everyone born from [birth year] through 1964. ? Anyone with known risk factors for hepatitis C.  Sexually transmitted infections (STIs)  You should be screened for sexually transmitted infections (STIs) including gonorrhea and chlamydia if: ? You are sexually active and are younger than 36 years of age. ? You are older than 36 years of age and your health care provider tells you that you are at risk for this type of infection. ? Your sexual activity has changed since you were last screened and you are at an increased risk for chlamydia or gonorrhea. Ask your health care provider if you are at risk.  If you do not have HIV, but are at risk, it may be recommended that you take a prescription medicine daily to prevent HIV infection. This is called pre-exposure prophylaxis (PrEP). You are considered at risk if: ? You are sexually active and do not regularly  use condoms or know the HIV status of your partner(s). ? You take drugs by injection. ? You are sexually active with a partner who has HIV.  Talk with your health care provider about whether you are at high risk of being infected with HIV. If you choose to begin PrEP, you should first be tested for HIV. You should then be tested every 3 months for as long as you are taking PrEP.  Pregnancy  If you are premenopausal and you may become pregnant, ask your health care provider about preconception counseling.  If you may become pregnant, take 400 to 800 micrograms (mcg) of folic acid every day.  If you want to prevent pregnancy, talk to your health care provider about birth control (contraception). Osteoporosis and menopause  Osteoporosis is a disease in which the bones lose minerals and strength with aging. This can result in serious bone fractures. Your risk for osteoporosis can be identified using a bone density scan.  If you are 36 years of age or older, or if you are at risk for osteoporosis and fractures, ask your health care provider if you should be screened.  Ask your health care provider whether you should take a calcium or vitamin D supplement to lower your risk for osteoporosis.  Menopause may have certain physical symptoms and risks.  Hormone replacement therapy may reduce some of these symptoms and risks. Talk to your health care provider about whether hormone replacement therapy is right for you. Follow these instructions at home:  Schedule regular health, dental, and eye exams.  Stay current with your immunizations.  Do not use any tobacco products including cigarettes, chewing tobacco, or electronic cigarettes.  If you are pregnant, do not drink alcohol.  If you are breastfeeding, limit how much and how often you drink alcohol.  Limit alcohol intake to no more than 1 drink per day for nonpregnant women. One drink equals 12 ounces of beer, 5 ounces of wine, or 1 ounces  of hard liquor.  Do not use street drugs.  Do not share needles.  Ask your health care provider for help if you need support or information about quitting drugs.  Tell your health care provider if you often feel depressed.  Tell your health care provider if you have ever been abused or do not feel safe at home. This information is not intended to replace advice given to you by your health care provider. Make sure you discuss any questions you have with your health care provider. Document Released: 03/07/2011 Document Revised: 01/28/2016 Document Reviewed: 05/26/2015 Elsevier Interactive Patient Education  [employer].

## 2018-01-13 LAB — HIV ANTIBODY (ROUTINE TESTING W REFLEX): HIV 1&2 Ab, 4th Generation: NONREACTIVE

## 2018-01-19 ENCOUNTER — Ambulatory Visit
Admission: RE | Admit: 2018-01-19 | Discharge: 2018-01-19 | Disposition: A | Discharge status: Home / Self Care | Payer: 59 | Source: Ambulatory Visit | Attending: Physician Assistant | Admitting: Physician Assistant

## 2018-01-19 DIAGNOSIS — R5383 Other fatigue: Secondary | ICD-10-CM

## 2018-01-19 DIAGNOSIS — E042 Nontoxic multinodular goiter: Secondary | ICD-10-CM | POA: Diagnosis not present

## 2018-01-19 DIAGNOSIS — E049 Nontoxic goiter, unspecified: Secondary | ICD-10-CM

## 2018-01-31 ENCOUNTER — Encounter: Payer: Self-pay | Admitting: Physician Assistant

## 2018-06-27 DIAGNOSIS — L508 Other urticaria: Secondary | ICD-10-CM | POA: Diagnosis not present

## 2019-05-22 DIAGNOSIS — Z1151 Encounter for screening for human papillomavirus (HPV): Secondary | ICD-10-CM | POA: Diagnosis not present

## 2019-05-22 DIAGNOSIS — Z6825 Body mass index (BMI) 25.0-25.9, adult: Secondary | ICD-10-CM | POA: Diagnosis not present

## 2019-05-22 DIAGNOSIS — Z01419 Encounter for gynecological examination (general) (routine) without abnormal findings: Secondary | ICD-10-CM | POA: Diagnosis not present

## 2019-09-02 DIAGNOSIS — Z20828 Contact with and (suspected) exposure to other viral communicable diseases: Secondary | ICD-10-CM | POA: Diagnosis not present

## 2019-09-17 ENCOUNTER — Other Ambulatory Visit: Payer: 59

## 2019-12-08 DIAGNOSIS — Z20828 Contact with and (suspected) exposure to other viral communicable diseases: Secondary | ICD-10-CM | POA: Diagnosis not present

## 2020-01-29 DIAGNOSIS — J988 Other specified respiratory disorders: Secondary | ICD-10-CM | POA: Diagnosis not present

## 2020-01-29 DIAGNOSIS — Z20822 Contact with and (suspected) exposure to covid-19: Secondary | ICD-10-CM | POA: Diagnosis not present

## 2020-01-29 DIAGNOSIS — J029 Acute pharyngitis, unspecified: Secondary | ICD-10-CM | POA: Diagnosis not present

## 2020-03-03 IMAGING — US US THYROID
1 series · 13 of 25 positions shown · non-contrast
Comparison: 01/21/2013 and previous back to 08/31/2010

CLINICAL DATA: Thyromegaly . Previous FNA biopsy of left and right
nodules 10/20/2010.

EXAM:
THYROID ULTRASOUND
TECHNIQUE: Ultrasound examination of the thyroid gland and adjacent soft
tissues was performed.

[Series 1: us thyroid · 0.04mm/px · 13 of 74 slices shown]
[im 1/74]
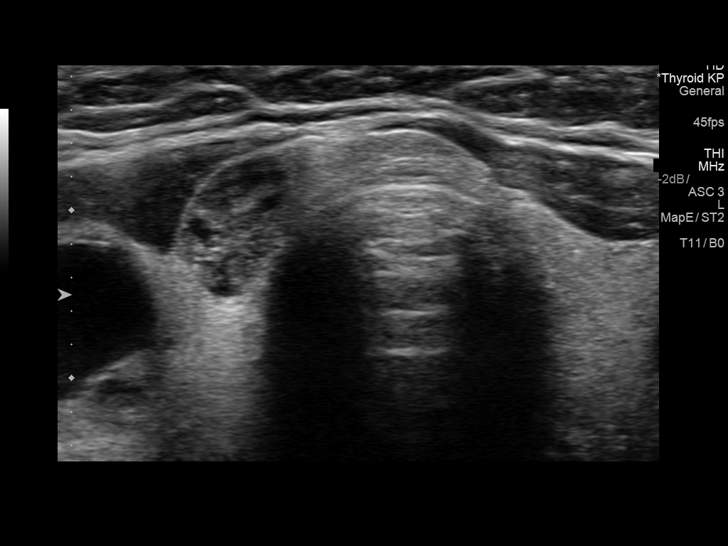
[im 7/74]
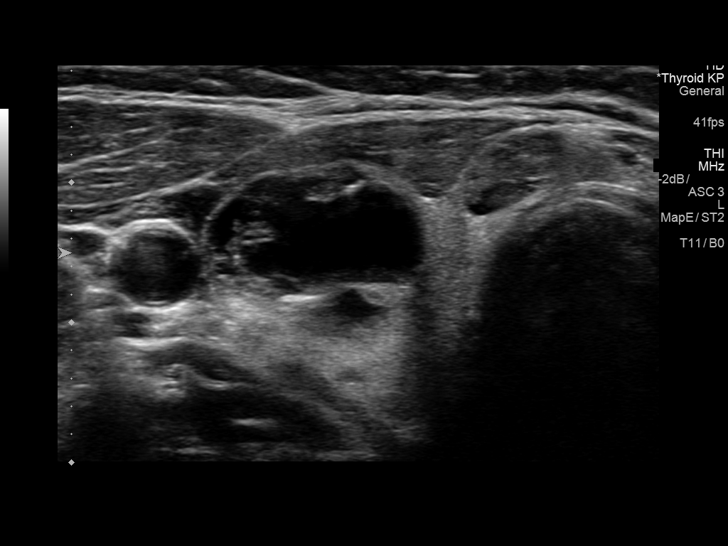
[im 13/74]
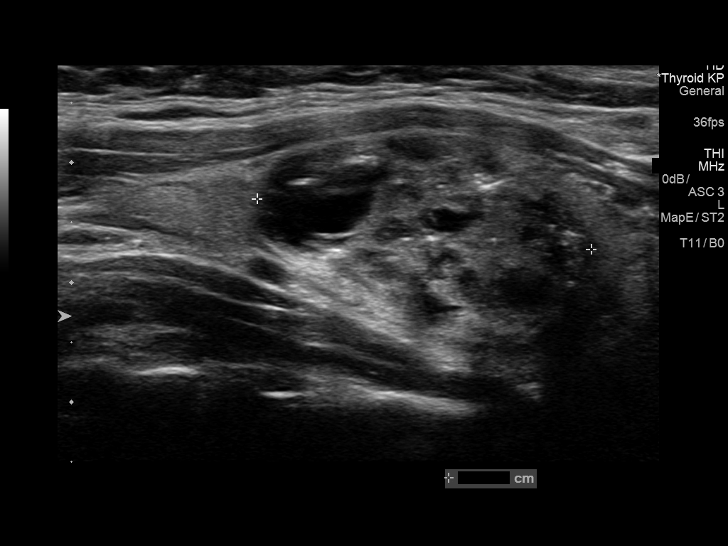
[im 19/74]
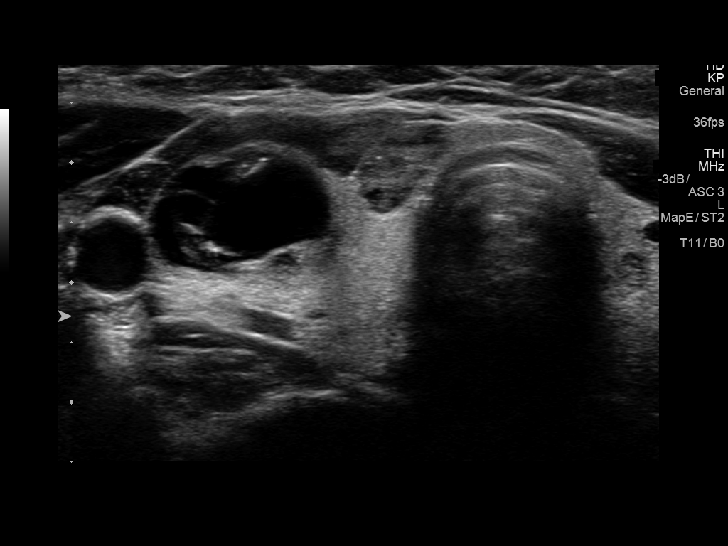
[im 25/74]
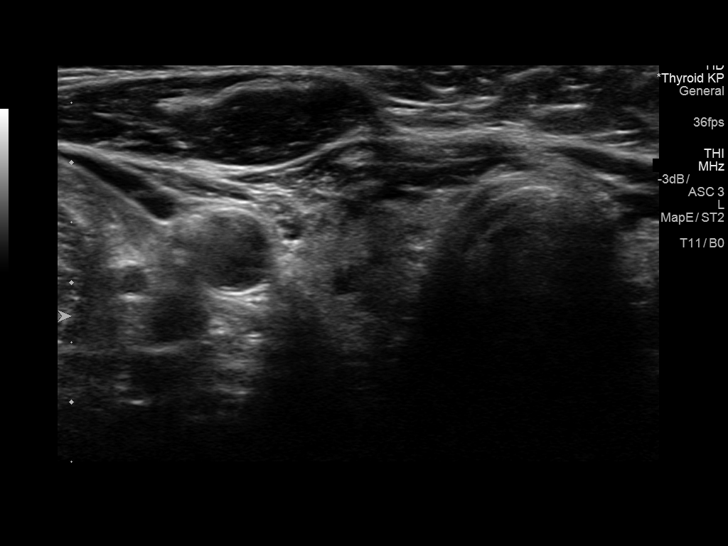
[im 31/74]
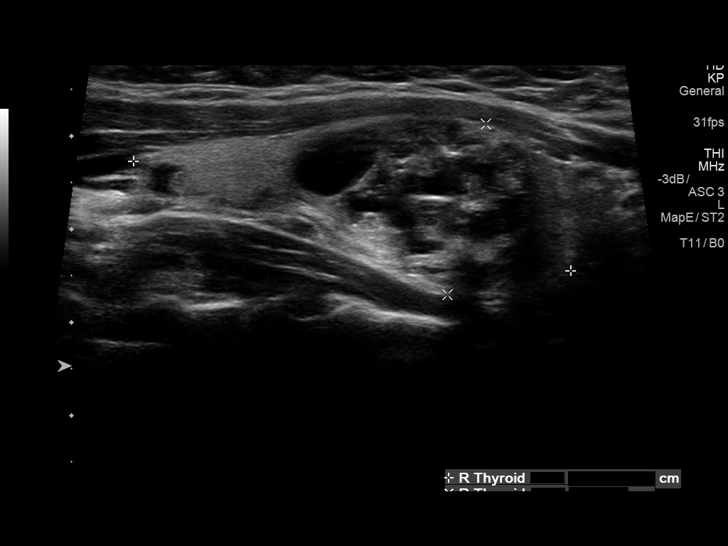
[im 37/74]
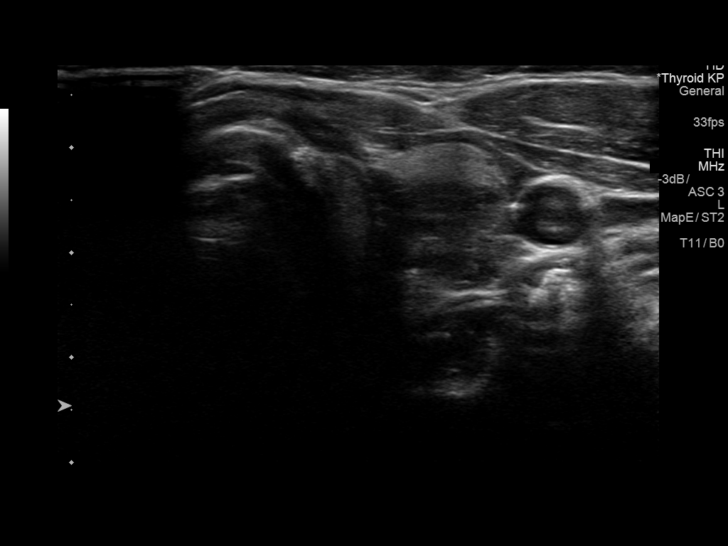
[im 43/74]
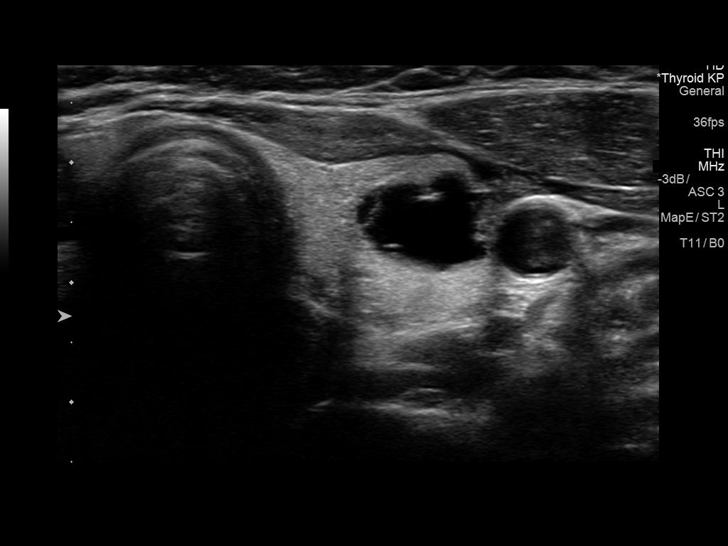
[im 49/74]
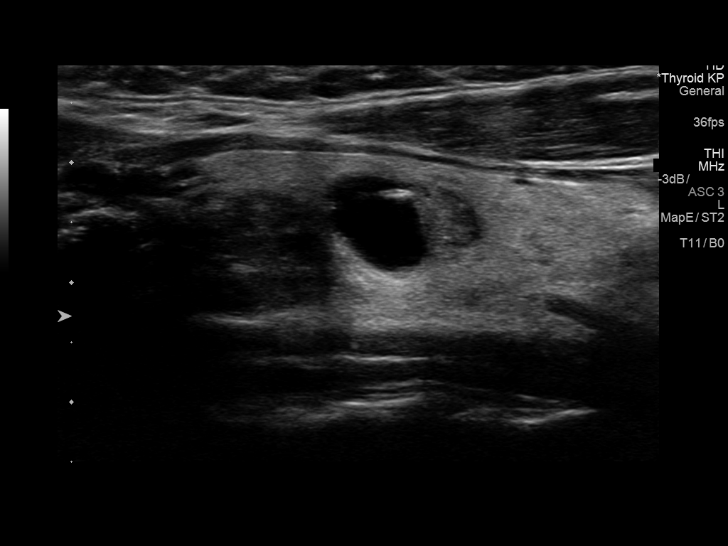
[im 55/74]
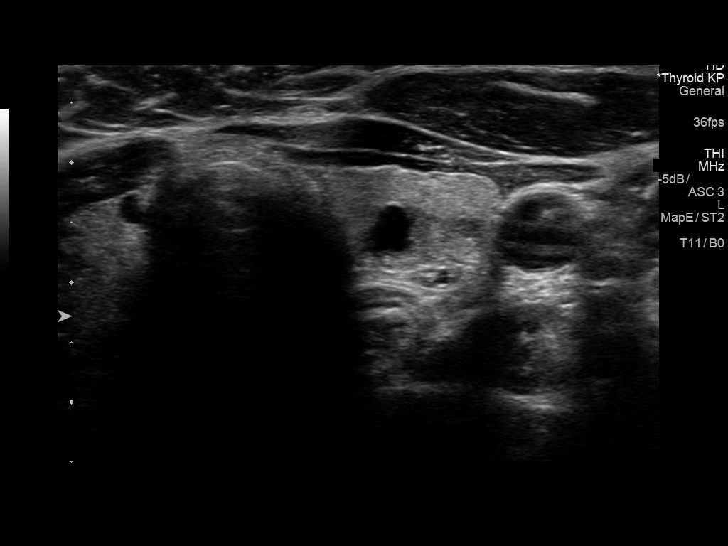
[im 61/74]
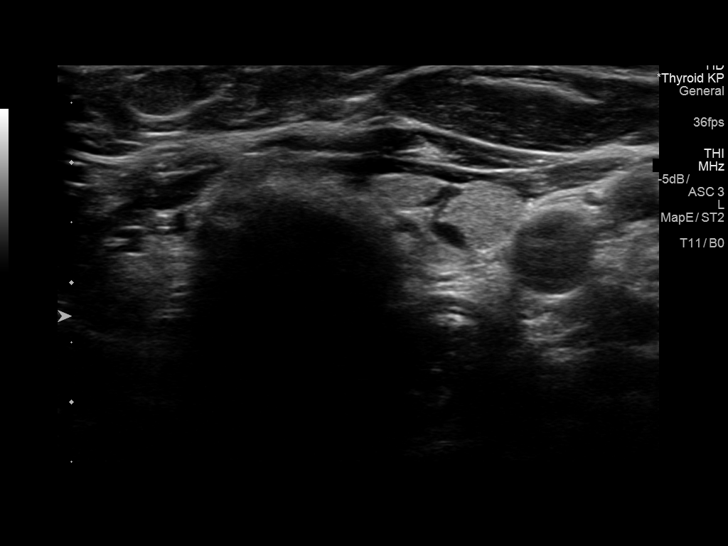
[im 67/74]
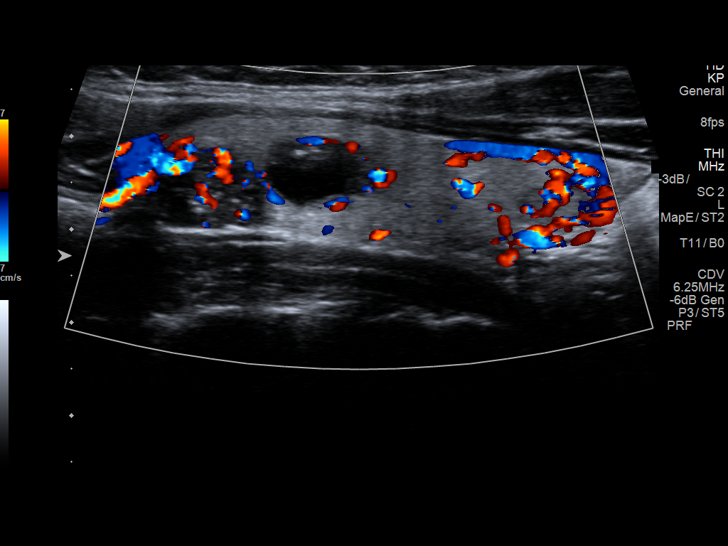
[im 74/74]
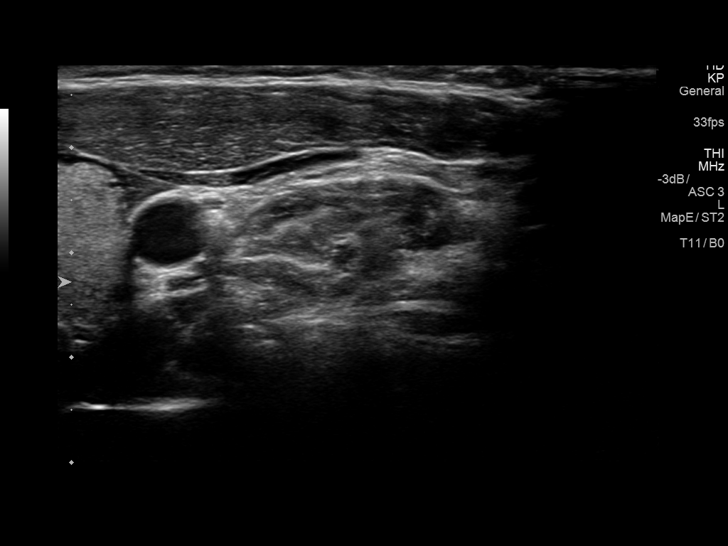

[13 of 25 positions shown; findings below may reference images not displayed]

FINDINGS: Parenchymal Echotexture: Mildly heterogenous

Isthmus: 0.2 cm thickness, stable

Right lobe: 4.8 x 1.9 x 2 cm, previously 4.7 x 1.7 x

Left lobe: 5.3 x 1.3 x 1.8 cm, previously 5 x 1.8 x

_________________________________________________________

Estimated total number of nodules >/= 1 cm: 4

Number of spongiform nodules >/=  2 cm not described below (TR1): 0

Number of mixed cystic and solid nodules >/= 1.5 cm not described
below (TR2): 0

_________________________________________________________

Nodule # 1: 2.8 x 1.8 x 1.7 cm complex mid right, previously 2.9 x
1.5 x 2.2; this was previously biopsied

Nodule # 2:

Location: Right; medial

Maximum size: 1.1 cm; Other 2 dimensions: 0.7 x 0.7 cm

Composition: mixed cystic and solid (1)

Echogenicity: hypoechoic (2)

Shape: not taller-than-wide (0)

Margins: ill-defined (0)

Echogenic foci: none (0)

ACR TI-RADS total points: 3.

ACR TI-RADS risk category: TR3 (3 points).

ACR TI-RADS recommendations:

Given size (<1.4 cm) and appearance, this nodule does NOT meet
TI-RADS criteria for biopsy or dedicated follow-up.

_________________________________________________________

Nodule # 3:

Prior biopsy: No

Location: Left; Superior

Maximum size: 1.2 cm; Other 2 dimensions: 1 x 1.1 cm, previously,
1.5 x 0.9 x 1.4 on 08/31/2010

Composition: mixed cystic and solid (1)

Echogenicity: hypoechoic (2)

Shape: not taller-than-wide (0)

Margins: ill-defined (0)

Echogenic foci: macrocalcifications (1)

ACR TI-RADS total points: 4.

ACR TI-RADS risk category:  TR4 (4-6 points).

Significant change in size (>/= 20% in two dimensions and minimal
increase of 2 mm): No

Change in features: No

Change in ACR TI-RADS risk category: No

ACR TI-RADS recommendations:

Stability for greater than 5 years implies benignity.

_________________________________________________________

Nodule # 4: 1.4 x 0.8 x 1.2 cm mid left, previously 1.9 x 1.3 x 1.7;
this was previously biopsied

Nodule # 5: 0.9 x 0.6 x 0.7 cm mixed solid/cystic cystic nodule
without calcifications, inferior left; does not meet criteria for
biopsy or follow-up.
IMPRESSION: 1. Mild thyromegaly with bilateral nodules. None meets current
criteria for biopsy or dedicated imaging follow-up.

The above is in keeping with the ACR TI-RADS recommendations - [HOSPITAL] 1635;[DATE].

## 2020-05-27 DIAGNOSIS — R519 Headache, unspecified: Secondary | ICD-10-CM | POA: Diagnosis not present

## 2020-05-27 DIAGNOSIS — Z20822 Contact with and (suspected) exposure to covid-19: Secondary | ICD-10-CM | POA: Diagnosis not present

## 2020-05-27 DIAGNOSIS — R0981 Nasal congestion: Secondary | ICD-10-CM | POA: Diagnosis not present

## 2020-06-22 DIAGNOSIS — Z6826 Body mass index (BMI) 26.0-26.9, adult: Secondary | ICD-10-CM | POA: Diagnosis not present

## 2020-06-22 DIAGNOSIS — Z01419 Encounter for gynecological examination (general) (routine) without abnormal findings: Secondary | ICD-10-CM | POA: Diagnosis not present

## 2020-07-08 DIAGNOSIS — T8332XA Displacement of intrauterine contraceptive device, initial encounter: Secondary | ICD-10-CM | POA: Diagnosis not present

## 2020-09-23 DIAGNOSIS — N83292 Other ovarian cyst, left side: Secondary | ICD-10-CM | POA: Diagnosis not present

## 2021-04-01 DIAGNOSIS — N83209 Unspecified ovarian cyst, unspecified side: Secondary | ICD-10-CM | POA: Diagnosis not present

## 2021-04-14 DIAGNOSIS — F4325 Adjustment disorder with mixed disturbance of emotions and conduct: Secondary | ICD-10-CM | POA: Diagnosis not present

## 2021-05-07 DIAGNOSIS — F4325 Adjustment disorder with mixed disturbance of emotions and conduct: Secondary | ICD-10-CM | POA: Diagnosis not present

## 2021-05-12 DIAGNOSIS — F4325 Adjustment disorder with mixed disturbance of emotions and conduct: Secondary | ICD-10-CM | POA: Diagnosis not present

## 2021-05-21 DIAGNOSIS — F4325 Adjustment disorder with mixed disturbance of emotions and conduct: Secondary | ICD-10-CM | POA: Diagnosis not present

## 2021-06-04 DIAGNOSIS — F4325 Adjustment disorder with mixed disturbance of emotions and conduct: Secondary | ICD-10-CM | POA: Diagnosis not present

## 2021-07-02 DIAGNOSIS — F4325 Adjustment disorder with mixed disturbance of emotions and conduct: Secondary | ICD-10-CM | POA: Diagnosis not present

## 2021-07-08 NOTE — Progress Notes (Addendum)
Tollie Eth, DNP, AGNP-c Primary Care & Sports Medicine [address]  [address] [place], [place] [address] [phone]  New patient visit   Patient: Carrie Oconnell   DOB: July 24, 1981   40 y.o. Female  MRN: [id number] Visit Date: 07/09/2021  Patient Care Team: Yevonne Yokum, Sung Amabile, NP as PCP - General (Nurse Practitioner) Darci Needle, MD (Endocrinology) Olivia Mackie, MD (Obstetrics and Gynecology)  Today's healthcare provider: Tollie Eth, NP   Chief Complaint  Patient presents with   Establish Care    Patient has had a PCP in the last 3 years. She has seen [facility] regularly (Dr. Rosemary Holms). Pat[place] has no concrns or complaints today. She admits that she is scheduled for a PAP next month with OB and has not ever had a mammo.   Subjective    Carrie Oconnell is a 40 y.o. female who presents today as a new patient to establish care.  HPI She is an overall healthy 40 year old female with no medical history.  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  She is married and has two children. She sees Dr. Billy Coast for GYN care- appt for pap coming up next month.  She takes no daily medications.  She has an IUD and no menstrual cycles.  She endorses wanting to loose about 15 pounds.    Past Medical History:  Diagnosis Date   Blood transfusion without reported diagnosis    2009 with first delivery   Past Surgical History:  Procedure Laterality Date   CESAREAN SECTION  x's 2    2009 & 2011   EYE SURGERY  09/2017   Lasik   WISDOM TOOTH EXTRACTION Bilateral 1998   Family Status  Relation Name Status   Other  (Not Specified)   MGM Kathie Rhodes rooks Alive   Father Orvan Seen Alive   PGM Dorothy English as a second language teacher Alive   Mother  Alive   Sister Beth Alive   MGF  Alive   PGF  Alive   Neg Hx  (Not Specified)   Family History  Problem Relation Age of Onset   Arthritis Other    Diabetes Maternal Grandmother    Hypertension Father     Diabetes Father    Diabetes Paternal Grandmother    Colon cancer Paternal Grandmother    Hypercholesterolemia Mother    Prostate cancer Maternal Grandfather    Breast cancer Neg Hx    Social History   Socioeconomic History   Marital status: Married    Spouse name: Not on file   Number of children: Not on file   Years of education: Not on file   Highest education level: Not on file  Occupational History   Not on file  Tobacco Use   Smoking status: Never   Smokeless tobacco: Never   Tobacco comments:    married, lives with spouse and 2 kids - works as Medical sales representative Use: Never used  Substance and Sexual Activity   Alcohol use: Yes    Alcohol/week: 7.0 standard drinks    Types: 4 Glasses of wine, 3 Standard drinks or equivalent per week    Comment: Socially   Drug use: No   Sexual activity: Yes    Birth control/protection: I.U.D.    Comment: MIrena  Other Topics Concern   Not on file  Social History Narrative   Works in [employer]   Married   9 and 40 y/o  Social Determinants of Health   Financial Resource Strain: Not on file  Food Insecurity: Not on file  Transportation Needs: Not on file  Physical Activity: Not on file  Stress: Not on file  Social Connections: Not on file   Outpatient Medications Prior to Visit  Medication Sig   [DISCONTINUED] ibuprofen (ADVIL) 200 MG tablet Take 400 mg by mouth as needed.   [DISCONTINUED] levonorgestrel (MIRENA) 20 MCG/24HR IUD 1 each by Intrauterine route once. Inserted on 2015 by GYN, needs to be removed in 2020.   No facility-administered medications prior to visit.   No Known Allergies  Immunization History  Administered Date(s) Administered   Influenza,inj,Quad PF,6+ Mos 06/18/2017   Influenza-Unspecified 06/18/2017   Tdap 09/14/2009    Health Maintenance  Topic Date Due   COVID-19 Vaccine (1) Never done   Hepatitis C Screening  Never done   TETANUS/TDAP  09/15/2019   PAP SMEAR-Modifier   12/20/2020   INFLUENZA VACCINE  04/05/2021   HIV Screening  Completed   Pneumococcal Vaccine 80-38 Years old  Aged Out   HPV VACCINES  Aged Out    Patient Care Team: Travante Knee, Sung Amabile, NP as PCP - General (Nurse Practitioner) Darci Needle, MD (Endocrinology) Olivia Mackie, MD (Obstetrics and Gynecology)  Review of Systems All review of systems negative except what is listed in the HPI    Objective    BP 98/72   Pulse 63   Ht 5\' 2"  (1.575 m)   Wt 152 lb (68.9 kg)   SpO2 98%   BMI 27.80 kg/m  Physical Exam Vitals and nursing note reviewed.  Constitutional:      General: She is not in acute distress.    Appearance: Normal appearance.  HENT:     Head: Normocephalic and atraumatic.     Right Ear: Hearing, tympanic membrane, ear canal and external ear normal.     Left Ear: Hearing, tympanic membrane, ear canal and external ear normal.     Nose: Nose normal.     Right Sinus: No maxillary sinus tenderness or frontal sinus tenderness.     Left Sinus: No maxillary sinus tenderness or frontal sinus tenderness.     Mouth/Throat:     Lips: Pink.     Mouth: Mucous membranes are moist.     Pharynx: Oropharynx is clear.  Eyes:     General: Lids are normal. Vision grossly intact.     Extraocular Movements: Extraocular movements intact.     Conjunctiva/sclera: Conjunctivae normal.     Pupils: Pupils are equal, round, and reactive to light.     Funduscopic exam:    Right eye: Red reflex present.        Left eye: Red reflex present.    Visual Fields: Right eye visual fields normal and left eye visual fields normal.  Neck:     Thyroid: No thyromegaly.     Vascular: No carotid bruit.  Cardiovascular:     Rate and Rhythm: Normal rate and regular rhythm.     Chest Wall: PMI is not displaced.     Pulses: Normal pulses.          Dorsalis pedis pulses are 2+ on the right side and 2+ on the left side.       Posterior tibial pulses are 2+ on the right side and 2+ on the left side.      Heart sounds: Normal heart sounds. No murmur heard. Pulmonary:     Effort: Pulmonary effort is normal. No  respiratory distress.     Breath sounds: Normal breath sounds.  Abdominal:     General: Abdomen is flat. Bowel sounds are normal. There is no distension.     Palpations: Abdomen is soft. There is no hepatomegaly, splenomegaly or mass.     Tenderness: There is no abdominal tenderness. There is no right CVA tenderness, left CVA tenderness, guarding or rebound.  Musculoskeletal:        General: Normal range of motion.     Cervical back: Full passive range of motion without pain, normal range of motion and neck supple. No tenderness.     Right lower leg: No edema.     Left lower leg: No edema.  Feet:     Left foot:     Toenail Condition: Left toenails are normal.  Lymphadenopathy:     Cervical: No cervical adenopathy.     Upper Body:     Right upper body: No supraclavicular adenopathy.     Left upper body: No supraclavicular adenopathy.  Skin:    General: Skin is warm and dry.     Capillary Refill: Capillary refill takes less than 2 seconds.     Nails: There is no clubbing.  Neurological:     General: No focal deficit present.     Mental Status: She is alert and oriented to person, place, and time.     GCS: GCS eye subscore is 4. GCS verbal subscore is 5. GCS motor subscore is 6.     Sensory: Sensation is intact.     Motor: Motor function is intact.     Coordination: Coordination is intact.     Gait: Gait is intact.     Deep Tendon Reflexes: Reflexes are normal and symmetric.  Psychiatric:        Attention and Perception: Attention normal.        Mood and Affect: Mood normal.        Speech: Speech normal.        Behavior: Behavior normal. Behavior is cooperative.        Thought Content: Thought content normal.        Cognition and Memory: Cognition and memory normal.        Judgment: Judgment normal.     Depression Screen PHQ 2/9 Scores 07/09/2021 01/12/2018  PHQ - 2  Score 0 0  PHQ- 9 Score 2 -   No results found for any visits on 07/09/21.  Assessment & Plan      Problem List Items Addressed This Visit     Encounter for medical examination to establish care - Primary    CPE today with labs.  Chart reviewed for medical history- no concerning findings.  Has not seen PCP in several years- no records needed.  UTD on pap.  Will order mammogram to be completed at age 34.  Will make changes to plan of care if needed based on labs.       Weight gain    Pt endorses concern with weight gain She has tried diets before but not successful staying on them Discussed monitoring carbohydrate intake to keep between 150-180g per day and increase protein.  Recommend small, frequent intake of low carb high protein, high fiber dietary options throughout the day to help maintain fullness and prevent blood sugar rise and falls that can trigger weight gain.  Increase water intake Get at least 30 minutes of cardiovascular activity every day to help burn excess carbohydrates to prevent storage.  Will get  labs today for further evaluation.       Other Visit Diagnoses     Screening for deficiency anemia       Relevant Orders   CBC with Differential/Platelet   Screening for endocrine, nutritional, metabolic and immunity disorder       Relevant Orders   Comprehensive metabolic panel   Hemoglobin A1c   TSH   VITAMIN D 25 Hydroxy (Vit-D Deficiency, Fractures)   Family history of diabetes mellitus       Relevant Orders   Hemoglobin A1c   Screening mammogram for breast cancer       Relevant Orders   MM Digital Screening        Return in about 1 year (around 07/09/2022) for CPE today- CPE in 1 year.      Carnell Casamento, Sung Amabile, NP, DNP, AGNP-C Primary Care & Sports Medicine at [facility]

## 2021-07-08 NOTE — Patient Instructions (Addendum)
Thank you for choosing [facility] for your Primary Care needs. I am excited for the opportunity to partner with you to meet your health care goals. It was a pleasure meeting you today!  Recommendations from today's visit: We will review your labs from today and let you know if there are any concerning findings or anything that needs addressed.  Please let us know if you have any needs. Otherwise, we will plan to see you in a year for your next physical.  I sent the order for the mammogram for next June.  150-180 grams of carbs per day divided into several small meals spread throughout day can be better than individual meals.  Chia seeds can be helpful to increase your fiber. Increased fiber can be super helpful to keep you full longer and lower your cholesterol and keep your blood sugar balanced.  Try to look for snacks that have protein and a small amount of carbs to help maintain blood sugar levels.  Quest snacks or similar may be helpful    Information on diet, exercise, and health maintenance recommendations are listed below. This is information to help you be sure you are on track for optimal health and monitoring.   Please look over this and let us know if you have any questions or if you have completed any of the health maintenance outside of [place] so that we can be sure your records are up to date.  ___________________________________________________________ About Me: I am an Adult-Geriatric Nurse Practitioner with a background in caring for patients for more than 20 years with a strong intensive care background. I provide primary care and sports medicine services to patients age 22 and older within this office. My education had a strong focus on caring for the older adult population, which I am passionate about. I am also the director of the APP Fellowship with [facility].   My desire is to provide you with the best service through preventive  medicine and supportive care. I consider you a part of the medical team and value your input. I work diligently to ensure that you are heard and your needs are met in a safe and effective manner. I want you to feel comfortable with me as your provider and want you to know that your health concerns are important to me.  For your information, our office hours are: Monday, Tuesday, and Thursday 8:00 AM - 5:00 PM Wednesday and Friday 8:00 AM - 12:00 PM.   In my time away from the office I am teaching new APP's within the system and am unavailable, but my partner, Dr. Burnard Bunting is in the office for emergent needs.   If you have questions or concerns, please call our office at [phone] or send [place] a MyChart message and we will respond as quickly as possible.  ____________________________________________________________ MyChart:  For all urgent or time sensitive needs we ask that you please call the office to avoid delays. Our number is [phone]. MyChart is not constantly monitored and due to the large volume of messages a day, replies may take up to 72 business hours.  MyChart Policy: MyChart allows for you to see your visit notes, after visit summary, provider recommendations, lab and tests results, make an appointment, request refills, and contact your provider or the office for non-urgent questions or concerns. Providers are seeing patients during normal business hours and do not have built in time to review MyChart messages.  We ask that  you allow a minimum of 3 business days for responses to [employer]. For this reason, please do not send urgent requests through [place]. Please call the office at [phone]. New and ongoing conditions may require a visit. We have virtual and in person visit available for your convenience.  Complex MyChart concerns may require a visit. Your provider may request you schedule a virtual or in person visit to ensure we are providing the best care  possible. MyChart messages sent after 11:00 AM on Friday will not be received by the provider until Monday morning.    Lab and Test Results: You will receive your lab and test results on MyChart as soon as they are completed and results have been sent by the lab or testing facility. Due to this service, you will receive your results BEFORE your provider.  I review lab and tests results each morning prior to seeing patients. Some results require collaboration with other providers to ensure you are receiving the most appropriate care. For this reason, we ask that you please allow a minimum of 3-5 business days from the time the ALL results have been received for your provider to receive and review lab and test results and contact you about these.  Most lab and test result comments from the provider will be sent through [place]. Your provider may recommend changes to the plan of care, follow-up visits, repeat testing, ask questions, or request an office visit to discuss these results. You may reply directly to this message or call the office at [phone] to provide information for the provider or set up an appointment. In some instances, you will be called with test results and recommendations. Please let us know if this is preferred and we will make note of this in your chart to provide this for you.    If you have not heard a response to your lab or test results in 5 business days from all results returning to [place], please call the office to let us know. We ask that you please avoid calling prior to this time unless there is an emergent concern. Due to high call volumes, this can delay the resulting process.  After Hours: For all non-emergency after hours needs, please call the office at [phone] and select the option to reach the on-call provider service. On-call services are shared between multiple [place] offices and therefore it will not be possible to speak directly with your provider.  On-call providers may provide medical advice and recommendations, but are unable to provide refills for maintenance medications.  For all emergency or urgent medical needs after normal business hours, we recommend that you seek care at the closest Urgent Care or Emergency Department to ensure appropriate treatment in a timely manner.  [facility] [place] has a 24 hour emergency room located on the ground floor for your convenience.   Urgent Concerns During the Business Day Providers are seeing patients from 8AM to [place] with a busy schedule and are most often not able to respond to non-urgent calls until the end of the day or the next business day. If you should have URGENT concerns during the day, please call and speak to the nurse or schedule a same day appointment so that we can address your concern without delay.   Thank you, again, for choosing me as your health care partner. I appreciate your trust and look forward to learning more about you.   Worthy Keeler, DNP, AGNP-c ___________________________________________________________  Health Maintenance Recommendations  Screening Testing Mammogram Every 1 -2 years based on history and risk factors Starting at age 42 Pap Smear Ages 21-39 every 3 years Ages 19-65 every 5 years with HPV testing More frequent testing may be required based on results and history Colon Cancer Screening Every 1-10 years based on test performed, risk factors, and history Starting at age 35 Bone Density Screening Every 2-10 years based on history Starting at age 37 for women Recommendations for men differ based on medication usage, history, and risk factors AAA Screening One time ultrasound Men 74-27 years old who have every smoked Lung Cancer Screening Low Dose Lung CT every 12 months Age 22-80 years with a 30 pack-year smoking history who still smoke or who have quit within the last 15 years  Screening Labs Routine  Labs: Complete Blood  Count (CBC), Complete Metabolic Panel (CMP), Cholesterol (Lipid Panel) Every 6-12 months based on history and medications May be recommended more frequently based on current conditions or previous results Hemoglobin A1c Lab Every 3-12 months based on history and previous results Starting at age 63 or earlier with diagnosis of diabetes, high cholesterol, BMI >26, and/or risk factors Frequent monitoring for patients with diabetes to ensure blood sugar control Thyroid Panel (TSH w/ T3 & T4) Every 6 months based on history, symptoms, and risk factors May be repeated more often if on medication HIV One time testing for all patients 18 and older May be repeated more frequently for patients with increased risk factors or exposure Hepatitis C One time testing for all patients 63 and older May be repeated more frequently for patients with increased risk factors or exposure Gonorrhea, Chlamydia Every 12 months for all sexually active persons 13-24 years Additional monitoring may be recommended for those who are considered high risk or who have symptoms PSA Men 53-54 years old with risk factors Additional screening may be recommended from age 58-69 based on risk factors, symptoms, and history  Vaccine Recommendations Tetanus Booster All adults every 10 years Flu Vaccine All patients 6 months and older every year COVID Vaccine All patients 12 years and older Initial dosing with booster May recommend additional booster based on age and health history HPV Vaccine 2 doses all patients age 20-26 Dosing may be considered for patients over 26 Shingles Vaccine (Shingrix) 2 doses all adults 89 years and older Pneumonia (Pneumovax 23) All adults 14 years and older May recommend earlier dosing based on health history Pneumonia (Prevnar 41) All adults 63 years and older Dosed 1 year after Pneumovax 23  Additional Screening, Testing, and Vaccinations may be recommended on an individualized basis  based on family history, health history, risk factors, and/or exposure.  __________________________________________________________  Diet Recommendations for All Patients  I recommend that all patients maintain a diet low in saturated fats, carbohydrates, and cholesterol. While this can be challenging at first, it is not impossible and small changes can make big differences.  Things to try: Decreasing the amount of soda, sweet tea, and/or juice to one or less per day and replace with water While water is always the first choice, if you do not like water you may consider adding a water additive without sugar to improve the taste other sugar free drinks Replace potatoes with a brightly colored vegetable at dinner Use healthy oils, such as canola oil or olive oil, instead of butter or hard margarine Limit your bread intake to two pieces or less a day Replace regular pasta with low carb pasta options Bake, broil, or  grill foods instead of frying Monitor portion sizes  Eat smaller, more frequent meals throughout the day instead of large meals  An important thing to remember is, if you love foods that are not great for your health, you don't have to give them up completely. Instead, allow these foods to be a reward when you have done well. Allowing yourself to still have special treats every once in a while is a nice way to tell yourself thank you for working hard to keep yourself healthy.   Also remember that every day is a new day. If you have a bad day and "fall off the wagon", you can still climb right back up and keep moving along on your journey!  We have resources available to help you!  Some websites that may be helpful include: www.http://carter.biz/  Www.VeryWellFit.com _____________________________________________________________  Activity Recommendations for All Patients  I recommend that all adults get at least 20 minutes of moderate physical activity that elevates your heart rate at  least 5 days out of the week.  Some examples include: Walking or jogging at a pace that allows you to carry on a conversation Cycling (stationary bike or outdoors) Water aerobics Yoga Weight lifting Dancing If physical limitations prevent you from putting stress on your joints, exercise in a pool or seated in a chair are excellent options.  Do determine your MAXIMUM heart rate for activity: YOUR AGE - 220 = MAX HeartRate   Remember! Do not push yourself too hard.  Start slowly and build up your pace, speed, weight, time in exercise, etc.  Allow your body to rest between exercise and get good sleep. You will need more water than normal when you are exerting yourself. Do not wait until you are thirsty to drink. Drink with a purpose of getting in at least 8, 8 ounce glasses of water a day plus more depending on how much you exercise and sweat.    If you begin to develop dizziness, chest pain, abdominal pain, jaw pain, shortness of breath, headache, vision changes, lightheadedness, or other concerning symptoms, stop the activity and allow your body to rest. If your symptoms are severe, seek emergency evaluation immediately. If your symptoms are concerning, but not severe, please let us know so that we can recommend further evaluation.

## 2021-07-09 ENCOUNTER — Encounter (HOSPITAL_BASED_OUTPATIENT_CLINIC_OR_DEPARTMENT_OTHER): Payer: Self-pay | Admitting: Nurse Practitioner

## 2021-07-09 ENCOUNTER — Other Ambulatory Visit: Payer: Self-pay

## 2021-07-09 ENCOUNTER — Ambulatory Visit (INDEPENDENT_AMBULATORY_CARE_PROVIDER_SITE_OTHER): Payer: BC Managed Care – PPO | Admitting: Nurse Practitioner

## 2021-07-09 VITALS — BP 98/72 | HR 63 | Ht 62.0 in | Wt 152.0 lb

## 2021-07-09 DIAGNOSIS — Z1321 Encounter for screening for nutritional disorder: Secondary | ICD-10-CM | POA: Diagnosis not present

## 2021-07-09 DIAGNOSIS — Z833 Family history of diabetes mellitus: Secondary | ICD-10-CM

## 2021-07-09 DIAGNOSIS — Z13228 Encounter for screening for other metabolic disorders: Secondary | ICD-10-CM | POA: Diagnosis not present

## 2021-07-09 DIAGNOSIS — Z130 Encounter for screening for diseases of the blood and blood-forming organs and certain disorders involving the immune mechanism: Secondary | ICD-10-CM

## 2021-07-09 DIAGNOSIS — R635 Abnormal weight gain: Secondary | ICD-10-CM

## 2021-07-09 DIAGNOSIS — Z0000 Encounter for general adult medical examination without abnormal findings: Secondary | ICD-10-CM

## 2021-07-09 DIAGNOSIS — Z1329 Encounter for screening for other suspected endocrine disorder: Secondary | ICD-10-CM

## 2021-07-09 DIAGNOSIS — Z1231 Encounter for screening mammogram for malignant neoplasm of breast: Secondary | ICD-10-CM

## 2021-07-09 NOTE — Assessment & Plan Note (Signed)
CPE today with labs.  Chart reviewed for medical history- no concerning findings.  Has not seen PCP in several years- no records needed.  UTD on pap.  Will order mammogram to be completed at age 40.  Will make changes to plan of care if needed based on labs.

## 2021-07-09 NOTE — Assessment & Plan Note (Signed)
Pt endorses concern with weight gain She has tried diets before but not successful staying on them Discussed monitoring carbohydrate intake to keep between 150-180g per day and increase protein.  Recommend small, frequent intake of low carb high protein, high fiber dietary options throughout the day to help maintain fullness and prevent blood sugar rise and falls that can trigger weight gain.  Increase water intake Get at least 30 minutes of cardiovascular activity every day to help burn excess carbohydrates to prevent storage.  Will get labs today for further evaluation.

## 2021-07-10 LAB — COMPREHENSIVE METABOLIC PANEL
ALT: 18 IU/L (ref 0–32)
AST: 20 IU/L (ref 0–40)
Albumin/Globulin Ratio: 1.8 (ref 1.2–2.2)
Albumin: 4.5 g/dL (ref 3.8–4.8)
Alkaline Phosphatase: 68 IU/L (ref 44–121)
BUN/Creatinine Ratio: 14 (ref 9–23)
BUN: 10 mg/dL (ref 6–20)
Bilirubin Total: 0.3 mg/dL (ref 0.0–1.2)
CO2: 24 mmol/L (ref 20–29)
Calcium: 9.4 mg/dL (ref 8.7–10.2)
Chloride: 99 mmol/L (ref 96–106)
Creatinine, Ser: 0.73 mg/dL (ref 0.57–1.00)
Globulin, Total: 2.5 g/dL (ref 1.5–4.5)
Glucose: 82 mg/dL (ref 70–99)
Potassium: 4.5 mmol/L (ref 3.5–5.2)
Sodium: 137 mmol/L (ref 134–144)
Total Protein: 7 g/dL (ref 6.0–8.5)
eGFR: 107 mL/min/{1.73_m2} (ref >59)

## 2021-07-10 LAB — CBC WITH DIFFERENTIAL/PLATELET
Basophils Absolute: 0 10*3/uL (ref 0.0–0.2)
Basos: 0 %
EOS (ABSOLUTE): 0.1 10*3/uL (ref 0.0–0.4)
Eos: 2 %
Hematocrit: 41.1 % (ref 34.0–46.6)
Hemoglobin: 13.9 g/dL (ref 11.1–15.9)
Immature Grans (Abs): 0 10*3/uL (ref 0.0–0.1)
Immature Granulocytes: 0 %
Lymphocytes Absolute: 2 10*3/uL (ref 0.7–3.1)
Lymphs: 33 %
MCH: 31.1 pg (ref 26.6–33.0)
MCHC: 33.8 g/dL (ref 31.5–35.7)
MCV: 92 fL (ref 79–97)
Monocytes Absolute: 0.7 10*3/uL (ref 0.1–0.9)
Monocytes: 12 %
Neutrophils Absolute: 3.2 10*3/uL (ref 1.4–7.0)
Neutrophils: 53 %
Platelets: 257 10*3/uL (ref 150–450)
RBC: 4.47 x10E6/uL (ref 3.77–5.28)
RDW: 12.4 % (ref 11.7–15.4)
WBC: 6.1 10*3/uL (ref 3.4–10.8)

## 2021-07-10 LAB — VITAMIN D 25 HYDROXY (VIT D DEFICIENCY, FRACTURES): Vit D, 25-Hydroxy: 38.5 ng/mL (ref 30.0–100.0)

## 2021-07-10 LAB — HEMOGLOBIN A1C
Est. average glucose Bld gHb Est-mCnc: 105 mg/dL
Hgb A1c MFr Bld: 5.3 % (ref 4.8–5.6)

## 2021-07-10 LAB — TSH: TSH: 0.499 u[IU]/mL (ref 0.450–4.500)

## 2021-07-13 ENCOUNTER — Encounter (HOSPITAL_BASED_OUTPATIENT_CLINIC_OR_DEPARTMENT_OTHER): Payer: BC Managed Care – PPO | Admitting: Nurse Practitioner

## 2021-07-13 ENCOUNTER — Encounter (HOSPITAL_BASED_OUTPATIENT_CLINIC_OR_DEPARTMENT_OTHER): Payer: Self-pay

## 2021-07-15 ENCOUNTER — Telehealth (HOSPITAL_BASED_OUTPATIENT_CLINIC_OR_DEPARTMENT_OTHER): Payer: Self-pay | Admitting: Nurse Practitioner

## 2021-07-15 DIAGNOSIS — F4325 Adjustment disorder with mixed disturbance of emotions and conduct: Secondary | ICD-10-CM | POA: Diagnosis not present

## 2021-07-15 NOTE — Telephone Encounter (Signed)
Pt dropped off CPE form for provider to sign for insurance purposes. Paper is in providers red tray. Please advise.

## 2021-07-15 NOTE — Telephone Encounter (Signed)
Paperwork placed in providers basket to review and sign

## 2021-07-16 DIAGNOSIS — Z01419 Encounter for gynecological examination (general) (routine) without abnormal findings: Secondary | ICD-10-CM | POA: Diagnosis not present

## 2021-07-16 DIAGNOSIS — Z124 Encounter for screening for malignant neoplasm of cervix: Secondary | ICD-10-CM | POA: Diagnosis not present

## 2021-07-16 DIAGNOSIS — Z6827 Body mass index (BMI) 27.0-27.9, adult: Secondary | ICD-10-CM | POA: Diagnosis not present

## 2021-07-16 DIAGNOSIS — Z113 Encounter for screening for infections with a predominantly sexual mode of transmission: Secondary | ICD-10-CM | POA: Diagnosis not present

## 2021-07-16 DIAGNOSIS — Z30431 Encounter for routine checking of intrauterine contraceptive device: Secondary | ICD-10-CM | POA: Diagnosis not present

## 2021-07-16 DIAGNOSIS — N83202 Unspecified ovarian cyst, left side: Secondary | ICD-10-CM | POA: Diagnosis not present

## 2021-07-22 NOTE — Telephone Encounter (Signed)
Called patient and let her know that form is ready for pick up, she stated that she will be here either today 11/17 or tomorrow morning. I let her know it will be upfront for her.

## 2021-08-06 DIAGNOSIS — F4325 Adjustment disorder with mixed disturbance of emotions and conduct: Secondary | ICD-10-CM | POA: Diagnosis not present

## 2021-09-24 DIAGNOSIS — F4325 Adjustment disorder with mixed disturbance of emotions and conduct: Secondary | ICD-10-CM | POA: Diagnosis not present

## 2021-10-08 DIAGNOSIS — F4325 Adjustment disorder with mixed disturbance of emotions and conduct: Secondary | ICD-10-CM | POA: Diagnosis not present

## 2021-10-22 DIAGNOSIS — F4325 Adjustment disorder with mixed disturbance of emotions and conduct: Secondary | ICD-10-CM | POA: Diagnosis not present

## 2021-11-05 DIAGNOSIS — F4325 Adjustment disorder with mixed disturbance of emotions and conduct: Secondary | ICD-10-CM | POA: Diagnosis not present

## 2021-12-03 DIAGNOSIS — F4325 Adjustment disorder with mixed disturbance of emotions and conduct: Secondary | ICD-10-CM | POA: Diagnosis not present

## 2022-03-03 ENCOUNTER — Ambulatory Visit
Admission: RE | Admit: 2022-03-03 | Discharge: 2022-03-03 | Disposition: A | Discharge status: Home / Self Care | Payer: BC Managed Care – PPO | Source: Ambulatory Visit | Attending: Nurse Practitioner | Admitting: Nurse Practitioner

## 2022-03-03 DIAGNOSIS — Z1231 Encounter for screening mammogram for malignant neoplasm of breast: Secondary | ICD-10-CM | POA: Diagnosis not present

## 2022-03-07 ENCOUNTER — Other Ambulatory Visit: Payer: Self-pay | Admitting: Nurse Practitioner

## 2022-03-07 DIAGNOSIS — R928 Other abnormal and inconclusive findings on diagnostic imaging of breast: Secondary | ICD-10-CM

## 2022-03-23 ENCOUNTER — Other Ambulatory Visit: Payer: Self-pay | Admitting: Nurse Practitioner

## 2022-03-23 ENCOUNTER — Ambulatory Visit
Admission: RE | Admit: 2022-03-23 | Discharge: 2022-03-23 | Disposition: A | Discharge status: Home / Self Care | Payer: BC Managed Care – PPO | Source: Ambulatory Visit | Attending: Nurse Practitioner | Admitting: Nurse Practitioner

## 2022-03-23 DIAGNOSIS — R928 Other abnormal and inconclusive findings on diagnostic imaging of breast: Secondary | ICD-10-CM

## 2022-03-23 DIAGNOSIS — N6321 Unspecified lump in the left breast, upper outer quadrant: Secondary | ICD-10-CM

## 2022-07-11 ENCOUNTER — Encounter (HOSPITAL_BASED_OUTPATIENT_CLINIC_OR_DEPARTMENT_OTHER): Payer: Self-pay | Admitting: Nurse Practitioner

## 2022-07-11 ENCOUNTER — Ambulatory Visit (INDEPENDENT_AMBULATORY_CARE_PROVIDER_SITE_OTHER): Payer: BC Managed Care – PPO | Admitting: Nurse Practitioner

## 2022-07-11 VITALS — BP 105/81 | HR 78 | Ht 62.0 in | Wt 155.2 lb

## 2022-07-11 DIAGNOSIS — E049 Nontoxic goiter, unspecified: Secondary | ICD-10-CM

## 2022-07-11 DIAGNOSIS — Z23 Encounter for immunization: Secondary | ICD-10-CM | POA: Diagnosis not present

## 2022-07-11 DIAGNOSIS — Z0000 Encounter for general adult medical examination without abnormal findings: Secondary | ICD-10-CM | POA: Diagnosis not present

## 2022-07-11 DIAGNOSIS — R5382 Chronic fatigue, unspecified: Secondary | ICD-10-CM | POA: Diagnosis not present

## 2022-07-11 DIAGNOSIS — L689 Hypertrichosis, unspecified: Secondary | ICD-10-CM

## 2022-07-11 NOTE — Progress Notes (Signed)
BP 105/81   Pulse 78   Ht _0  (1.575 m)   Wt 155 lb 3.2 oz (70.4 kg)   SpO2 100%   BMI 28.39 kg/m    Subjective:    Patient ID: Carrie Oconnell, female    DOB: [birth year], 41 y.o.   MRN: [id number]  HPI: Carrie Oconnell is a 41 y.o. female presenting on 07/11/2022 for comprehensive medical examination.   Current medical concerns include:   Chronic Fatigue: Carrie Oconnell tells me she feels like she sleeps well and falls asleep quickly, but wakes up feeling not well rested. This has been ongoing for a while. She is not sure if it is hormonal or possibly something else going on.    Hair growth: Carrie Oconnell endorses concerns with new facial hair growth over the last few months, specifically. She has not had this issue in the past. She would like to know if this is normal, what can be done, and what to expect with or without treatment.   She reports regular vision exams q1-5y: yes She reports regular dental exams q 19m yes Her diet consists of:  healthy- no specific restritctions She endorses exercise and/or activity of:  daily walkig She works in:  CForensic scientist She endorses ETOH use ( 4-5/wk ) She denies nictoine use  She denies illegal substance use   She is not having menstrual periods.  She  denies abnormal bleeding. She  endorses menopausal symptoms: facial hair, fatigue  She is is sexually active. She currently has 1 sexual partners.  She denies concerns today about STI: testing ordered: no  She denies concerns about skin changes today  She denies concerns about bowel changes today  She denies concerns about bladder changes today   Most Recent Depression Screen:     07/11/2022    6:08 PM 07/09/2021   11:34 AM 01/12/2018    9:14 AM  Depression screen PHQ 2/9  Decreased Interest 0 0 0  Down, Depressed, Hopeless 0 0 0  PHQ - 2 Score 0 0 0  Altered sleeping  1   Tired, decreased energy  1   Change in appetite  0   Feeling bad or failure about yourself   0    Trouble concentrating  0   Moving slowly or fidgety/restless  0   Suicidal thoughts  0   PHQ-9 Score  2    Most Recent Anxiety Screen:     07/09/2021   11:34 AM  GAD 7 : Generalized Anxiety Score  Nervous, Anxious, on Edge 1  Control/stop worrying 0  Worry too much - different things 0  Trouble relaxing 1  Restless 0  Easily annoyed or irritable 0  Afraid - awful might happen 0  Total GAD 7 Score 2  Anxiety Difficulty Not difficult at all   Most Recent Fall Screen:    07/11/2022    6:34 PM 07/09/2021   11:34 AM  Fall Risk   Falls in the past year?  0  Number falls in past yr: 0 0  Injury with Fall? 0 0  Risk for fall due to : No Fall Risks No Fall Risks  Follow up Falls evaluation completed Falls evaluation completed;Education provided    All ROS negative except what is listed above and in the HPI.   Past medical history, surgical history, medications, allergies, family history and social history reviewed with patient today and changes made to appropriate areas of the chart.  Past Medical History:  Past Medical History:  Diagnosis Date   Blood transfusion without reported diagnosis    2009 with first delivery   Medications:  No current outpatient medications on file prior to visit.   No current facility-administered medications on file prior to visit.   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   Social History   Substance and Sexual Activity  Alcohol Use Yes   Alcohol/week: 7.0 standard drinks of alcohol   Types: 4 Glasses of wine, 3 Standard drinks or equivalent per week   Comment: Socially   Family History:  Family History  Problem Relation Age of Onset   Arthritis Other    Diabetes Maternal Grandmother    Hypertension Father    Diabetes Father    Diabetes Paternal Grandmother    Colon cancer Paternal Grandmother    Hypercholesterolemia Mother    Prostate cancer Maternal Grandfather    Breast cancer Neg Hx        Objective:    BP 105/81   Pulse 78   Ht _0  (1.575 m)   Wt 155 lb 3.2 oz (70.4 kg)   SpO2 100%   BMI 28.39 kg/m   Wt Readings from Last 3 Encounters:  07/11/22 155 lb 3.2 oz (70.4 kg)  07/09/21 152 lb (68.9 kg)  01/12/18 148 lb 8 oz (67.4 kg)    Physical Exam Vitals and nursing note reviewed.  Constitutional:      General: She is not in acute distress.    Appearance: Normal appearance.  HENT:     Head: Normocephalic and atraumatic.     Right Ear: Hearing,  tympanic membrane, ear canal and external ear normal.     Left Ear: Hearing, tympanic membrane, ear canal  and external ear normal.     Nose: Nose normal.     Right Sinus: No maxillary sinus tenderness or frontal sinus tenderness.     Left Sinus: No maxillary sinus tenderness or frontal sinus tenderness.     Mouth/Throat:     Lips: Pink.     Mouth: Mucous membranes are moist.     Pharynx: Oropharynx is clear.  Eyes:     General: Lids are normal. Vision grossly intact.     Extraocular Movements: Extraocular movements intact.     Conjunctiva/sclera: Conjunctivae normal.     Pupils: Pupils are equal, round, and reactive to light.     Funduscopic exam:    Right eye: Red reflex present.        Left eye: Red reflex present.    Visual Fields: Right eye visual fields normal and left eye visual fields normal.  Neck:     Thyroid: No thyromegaly.     Vascular: No carotid bruit.  Cardiovascular:     Rate and Rhythm: Normal rate and regular rhythm.     Chest Wall: PMI is not displaced.     Pulses: Normal pulses.          Dorsalis pedis pulses are 2+ on the right side and 2+ on the left side.       Posterior tibial pulses are 2+ on the right side and 2+ on the left side.     Heart sounds: Normal heart sounds. No murmur heard. Pulmonary:     Effort: Pulmonary effort is normal. No respiratory distress.     Breath sounds: Normal breath sounds.  Abdominal:     General: Abdomen is flat. Bowel sounds are normal. There is no distension.     Palpations: Abdomen is soft. There is no hepatomegaly, splenomegaly or mass.     Tenderness: There is no abdominal tenderness. There is no right CVA tenderness, left CVA tenderness, guarding or rebound.  Musculoskeletal:        General: Normal range of motion.     Cervical back: Full passive range of motion without pain, normal range of motion and neck supple. No tenderness.     Right lower leg: No edema.     Left lower leg: No edema.  Feet:     Left foot:      Toenail Condition: Left toenails are normal.  Lymphadenopathy:     Cervical: No cervical adenopathy.     Upper Body:     Right upper body: No supraclavicular adenopathy.     Left upper body: No supraclavicular adenopathy.  Skin:    General: Skin is warm and dry.     Capillary Refill: Capillary refill takes less than 2 seconds.     Nails: There is no clubbing.  Neurological:     General: No focal deficit present.     Mental Status: She is alert and oriented to person, place, and time.     GCS: GCS eye subscore is 4. GCS verbal subscore is 5. GCS motor subscore is 6.     Sensory: Sensation is intact.     Motor: Motor function is intact.     Coordination: Coordination is intact.     Gait: Gait is intact.     Deep Tendon Reflexes: Reflexes are normal and symmetric.  Psychiatric:        Attention and Perception: Attention normal.        Mood and Affect: Mood normal.  Speech: Speech normal.        Behavior: Behavior normal. Behavior is cooperative.        Thought Content: Thought content normal.        Cognition and Memory: Cognition and memory normal.        Judgment: Judgment normal.     Results for orders placed or performed in visit on 07/09/21  CBC with Differential/Platelet  Result Value Ref Range   WBC 6.1 3.4 - 10.8 x10E3/uL   RBC 4.47 3.77 - 5.28 x10E6/uL   Hemoglobin 13.9 11.1 - 15.9 g/dL   Hematocrit 41.1 34.0 - 46.6 %   MCV 92 79 - 97 fL   MCH 31.1 26.6 - 33.0 pg   MCHC 33.8 31.5 - 35.7 g/dL   RDW 12.4 11.7 - 15.4 %   Platelets 257 150 - 450 x10E3/uL   Neutrophils 53 Not Estab. %   Lymphs 33 Not Estab. %   Monocytes 12 Not Estab. %   Eos 2 Not Estab. %   Basos 0 Not Estab. %   Neutrophils Absolute 3.2 1.4 - 7.0 x10E3/uL   Lymphocytes Absolute 2.0 0.7 - 3.1 x10E3/uL   Monocytes Absolute 0.7 0.1 - 0.9 x10E3/uL   EOS (ABSOLUTE) 0.1 0.0 - 0.4 x10E3/uL   Basophils Absolute 0.0 0.0 - 0.2 x10E3/uL   Immature Granulocytes 0 Not Estab. %   Immature Grans (Abs)  0.0 0.0 - 0.1 x10E3/uL  Comprehensive metabolic panel  Result Value Ref Range   Glucose 82 70 - 99 mg/dL   BUN 10 6 - 20 mg/dL   Creatinine, Ser 0.73 0.57 - 1.00 mg/dL   eGFR 107 >59 mL/min/1.73   BUN/Creatinine Ratio 14 9 - 23   Sodium 137 134 - 144 mmol/L   Potassium 4.5 3.5 - 5.2 mmol/L   Chloride 99 96 - 106 mmol/L   CO2 24 20 - 29 mmol/L   Calcium 9.4 8.7 - 10.2 mg/dL   Total Protein 7.0 6.0 - 8.5 g/dL   Albumin 4.5 3.8 - 4.8 g/dL   Globulin, Total 2.5 1.5 - 4.5 g/dL   Albumin/Globulin Ratio 1.8 1.2 - 2.2   Bilirubin Total 0.3 0.0 - 1.2 mg/dL   Alkaline Phosphatase 68 44 - 121 IU/L   AST 20 0 - 40 IU/L   ALT 18 0 - 32 IU/L  Hemoglobin A1c  Result Value Ref Range   Hgb A1c MFr Bld 5.3 4.8 - 5.6 %   Est. average glucose Bld gHb Est-mCnc 105 mg/dL  TSH  Result Value Ref Range   TSH 0.499 0.450 - 4.500 uIU/mL  VITAMIN D 25 Hydroxy (Vit-D Deficiency, Fractures)  Result Value Ref Range   Vit D, 25-Hydroxy 38.5 30.0 - 100.0 ng/mL    MMUNIZATIONS:   - Tdap: Tetanus vaccination status reviewed: last tetanus booster within 10 years. - Influenza:  UTDD - Pneumovax: Not applicable - Prevnar: Not applicable - HPV: Not applicable - Zostavax vaccine: Not applicable  SCREENING COMPLETED: - Pap smear:  UTD - STI testing:Not Applicable -Mammogram:  UTD - Colonoscopy: {Not Applicable - Bone Density: {Not Applicable -Hearing Test: Not Applicable -Spirometry: Not Applicable     Assessment & Plan:   Problem List Items Addressed This Visit     Thyroid goiter    Chronic. Thyroid slightly enlarged on examination today with no associated abnormalities. Will monitor labs today given concerns with hair growth and fatigue.       Encounter for annual physical exam - Primary  CPE today with no abnormalities noted on exam.  Labs pending. Will make changes as necessary based on results.  Review of HM activities and recommendations discussed and provided on AVS Anticipatory  guidance, diet, and exercise recommendations provided.  Medications, allergies, and hx reviewed and updated as necessary.  Plan to f/u with CPE in 1 year or sooner for acute/chronic health needs as directed.        Relevant Orders   CBC with Differential/Platelet   Comprehensive metabolic panel   Lipid panel   TSH   T4, free   VITAMIN D 25 Hydroxy (Vit-D Deficiency, Fractures)   Hemoglobin A1c   B12 and Folate Panel   FSH/LH   Testosterone   Excessive hair growth    Concerns with facial hair growth for the past several months. Etiology unclear at this time. Discussion with patient on possibility of hormonal changes associated with peri-menopause contributing to this finding. Will monitor hormones and thyroid labs today for evaluation. Discussed the option of spironolactone as androgen blocker to help with symptoms, she would like to think about this. Discussion of commonality of finding during periods of hormonal change and ways to help with hair growth aside from oral medication. Will determine lab findings before further recommendations made.       Relevant Orders   TSH   T4, free   FSH/LH   Testosterone   Chronic fatigue    Ongoing fatigue of unknown etiology. Unclear if this is possibly hormonal given the other symptoms she is experiencing. Will monitor labs today. Changes to the plan of care to be made based on lab findings as appropriate.       Relevant Orders   TSH   T4, free   B12 and Folate Panel   FSH/LH   Testosterone   Other Visit Diagnoses     Vaccine for diphtheria-tetanus              Follow up plan: Return in about 1 year (around 07/12/2023) for CPE.  NEXT PREVENTATIVE PHYSICAL DUE IN 1 YEAR.  PATIENT COUNSELING PROVIDED FOR ALL ADULT PATIENTS:  Consume a well balanced diet low in saturated fats, cholesterol, and moderation in carbohydrates.   This can be as simple as monitoring portion sizes and cutting back on sugary beverages such as soda and  juice to start with.    Daily water consumption of at least 64 ounces.  Physical activity at least 180 minutes per week, if just starting out.   This can be as simple as taking the stairs instead of the elevator and walking 2-3 laps around the office  purposefully every day.   STD protection, partner selection, and regular testing if high risk.  Limited consumption of alcoholic beverages if alcohol is consumed.  For women, I recommend no more than 7 alcoholic beverages per week, spread out throughout the week.  Avoid "binge" drinking or consuming large quantities of alcohol in one setting.   Please let me know if you feel you may need help with reduction or quitting alcohol consumption.   Avoidance of nicotine, if used.  Please let me know if you feel you may need help with reduction or quitting nicotine use.   Daily mental health attention.  This can be in the form of 5 minute daily meditation, prayer, journaling, yoga, reflection, etc.   Purposeful attention to your emotions and mental state can significantly improve your overall wellbeing and Health.  Please know that I am here to help you  with all of your health care goals and am happy to work with you to find a solution that works best for you.  The greatest advice I have received with any changes in life are to take it one step at a time, that even means if all you can focus on is the next 60 seconds, then do that and celebrate your victories.  With any changes in life, you will have set backs, and that is OK. The important thing to remember is, if you have a set back, it is not a failure, it is an opportunity to try again!  Health Maintenance Recommendations Screening Testing Mammogram Every 1 -2 years based on history and risk factors Starting at age 42 Pap Smear Ages 21-39 every 3 years Ages 63-65 every 5 years with HPV testing More frequent testing may be required based on results and history Colon Cancer Screening Every  1-10 years based on test performed, risk factors, and history Starting at age 58 Bone Density Screening Every 2-10 years based on history Starting at age 643 for women Recommendations for men differ based on medication usage, history, and risk factors AAA Screening One time ultrasound Men 36-68 years old who have every smoked Lung Cancer Screening Low Dose Lung CT every 12 months Age 62-80 years with a 30 pack-year smoking history who still smoke or who have quit within the last 15 years  Screening Labs Routine  Labs: Complete Blood Count (CBC), Complete Metabolic Panel (CMP), Cholesterol (Lipid Panel) Every 6-12 months based on history and medications May be recommended more frequently based on current conditions or previous results Hemoglobin A1c Lab Every 3-12 months based on history and previous results Starting at age 18 or earlier with diagnosis of diabetes, high cholesterol, BMI >26, and/or risk factors Frequent monitoring for patients with diabetes to ensure blood sugar control Thyroid Panel (TSH w/ T3 & T4) Every 6 months based on history, symptoms, and risk factors May be repeated more often if on medication HIV One time testing for all patients 20 and older May be repeated more frequently for patients with increased risk factors or exposure Hepatitis C One time testing for all patients 18 and older May be repeated more frequently for patients with increased risk factors or exposure Gonorrhea, Chlamydia Every 12 months for all sexually active persons 13-24 years Additional monitoring may be recommended for those who are considered high risk or who have symptoms PSA Men 37-54 years old with risk factors Additional screening may be recommended from age 52-69 based on risk factors, symptoms, and history  Vaccine Recommendations Tetanus Booster All adults every 10 years Flu Vaccine All patients 6 months and older every year COVID Vaccine All patients 12 years and  older Initial dosing with booster May recommend additional booster based on age and health history HPV Vaccine 2 doses all patients age 64-26 Dosing may be considered for patients over 26 Shingles Vaccine (Shingrix) 2 doses all adults 72 years and older Pneumonia (Pneumovax 23) All adults 36 years and older May recommend earlier dosing based on health history Pneumonia (Prevnar 43) All adults 40 years and older Dosed 1 year after Pneumovax 23  Additional Screening, Testing, and Vaccinations may be recommended on an individualized basis based on family history, health history, risk factors, and/or exposure.

## 2022-07-11 NOTE — Assessment & Plan Note (Signed)
Concerns with facial hair growth for the past several months. Etiology unclear at this time. Discussion with patient on possibility of hormonal changes associated with peri-menopause contributing to this finding. Will monitor hormones and thyroid labs today for evaluation. Discussed the option of spironolactone as androgen blocker to help with symptoms, she would like to think about this. Discussion of commonality of finding during periods of hormonal change and ways to help with hair growth aside from oral medication. Will determine lab findings before further recommendations made.

## 2022-07-11 NOTE — Assessment & Plan Note (Signed)
Chronic. Thyroid slightly enlarged on examination today with no associated abnormalities. Will monitor labs today given concerns with hair growth and fatigue.

## 2022-07-11 NOTE — Assessment & Plan Note (Signed)
Ongoing fatigue of unknown etiology. Unclear if this is possibly hormonal given the other symptoms she is experiencing. Will monitor labs today. Changes to the plan of care to be made based on lab findings as appropriate.

## 2022-07-11 NOTE — Patient Instructions (Signed)
It was a pleasure seeing you today. I hope your time spent with [place] was pleasant and helpful. Please let us know if there is anything we can do to improve the service you receive.   Today we discussed concerns with:  Encounter for annual physical exam I will let you know what your labs show and if we can find a reason for your tiredness and hair growth. Please let me know if you have any other questions or concerns with either.  I will be transitioning to my new practice on 11/20. Thank you for allowing me to be a part of your care.   The following orders have been placed for you today:  Orders Placed This Encounter  Procedures   CBC with Differential/Platelet    Order Specific Question:   Release to patient    Answer:   Immediate   Comprehensive metabolic panel    Order Specific Question:   Has the patient fasted?    Answer:   Yes    Order Specific Question:   Release to patient    Answer:   Immediate   Lipid panel    Order Specific Question:   Has the patient fasted?    Answer:   Yes    Order Specific Question:   Release to patient    Answer:   Immediate   TSH    Order Specific Question:   Release to patient    Answer:   Immediate   T4, free   VITAMIN D 25 Hydroxy (Vit-D Deficiency, Fractures)    Order Specific Question:   Release to patient    Answer:   Immediate   Hemoglobin A1c    Order Specific Question:   Release to patient    Answer:   Immediate   B12 and Folate Panel   FSH/LH    Order Specific Question:   Release to patient    Answer:   Immediate   Testosterone    Order Specific Question:   Release to patient    Answer:   Immediate     Important Office Information Lab Results If labs were ordered, please note that you will see results through MyChart as soon as they come available from [place].  It takes up to 5 business days for the results to be routed to me and for me to review them once all of the lab results have come through from [facility]. I will make  recommendations based on your results and send these through [place] or someone from the office will call you to discuss. If your labs are abnormal, we may contact you to schedule a visit to discuss the results and make recommendations.  If you have not heard from [place] within 5 business days or you have waited longer than a week and your lab results have not come through on [place], please feel free to call the office or send a message through [place] to follow-up on these labs.   Referrals If referrals were placed today, the office where the referral was sent will contact you either by phone or through [place] to set up scheduling. Please note that it can take up to a week for the referral office to contact you. If you do not hear from them in a week, please contact the referral office directly to inquire about scheduling.   Condition Treated If your condition worsens or you begin to have new symptoms, please schedule a follow-up appointment for further evaluation. If you are not  sure if an appointment is needed, you may call the office to leave a message for the nurse and someone will contact you with recommendations.  If you have an urgent or life threatening emergency, please do not call the office, but seek emergency evaluation by calling 911 or going to the nearest emergency room for evaluation.   MyChart and Phone Calls Please do not use MyChart for urgent messages. It may take up to 3 business days for MyChart messages to be read by staff and if they are unable to handle the request, an additional 3 business days for them to be routed to me and for my response.  Messages sent to the provider through MyChart do not come directly to the provider, please allow time for these messages to be routed and for me to respond.  We get a large volume of MyChart messages daily and these are responded to in the order received.   For urgent messages, please call the office at [phone] and speak with the  front office staff or leave a message on the line of my assistant for guidance.  We are seeing patients from the hours of 8:00 am through 5:00 pm and calls directly to the nurse may not be answered immediately due to seeing patients, but your call will be returned as soon as possible.  Phone  messages received after 4:00 PM Monday through Thursday may not be returned until the following business day. Phone messages received after 11:00 AM on Friday may not be returned until Monday.   After Hours We share on call hours with providers from other offices. If you have an urgent need after hours that cannot wait until the next business day, please contact the on call provider by calling the office number. A nurse will speak with you and contact the provider if needed for recommendations.  If you have an urgent or life threatening emergency after hours, please do not call the on call provider, but seek emergency evaluation by calling 911 or going to the nearest emergency room for evaluation.   Paperwork All paperwork requires a minimum of 5 days to complete and return to you or the designated personnel. Please keep this in mind when bringing in forms or sending requests for paperwork completion to the office.

## 2022-07-11 NOTE — Assessment & Plan Note (Signed)

## 2022-07-12 LAB — CBC WITH DIFFERENTIAL/PLATELET
Basophils Absolute: 0 10*3/uL (ref 0.0–0.2)
Basos: 0 %
EOS (ABSOLUTE): 0.2 10*3/uL (ref 0.0–0.4)
Eos: 3 %
Hematocrit: 41.5 % (ref 34.0–46.6)
Hemoglobin: 14 g/dL (ref 11.1–15.9)
Immature Grans (Abs): 0 10*3/uL (ref 0.0–0.1)
Immature Granulocytes: 0 %
Lymphocytes Absolute: 2.4 10*3/uL (ref 0.7–3.1)
Lymphs: 40 %
MCH: 30.9 pg (ref 26.6–33.0)
MCHC: 33.7 g/dL (ref 31.5–35.7)
MCV: 92 fL (ref 79–97)
Monocytes Absolute: 0.6 10*3/uL (ref 0.1–0.9)
Monocytes: 10 %
Neutrophils Absolute: 2.8 10*3/uL (ref 1.4–7.0)
Neutrophils: 47 %
Platelets: 251 10*3/uL (ref 150–450)
RBC: 4.53 x10E6/uL (ref 3.77–5.28)
RDW: 12.1 % (ref 11.7–15.4)
WBC: 6 10*3/uL (ref 3.4–10.8)

## 2022-07-12 LAB — COMPREHENSIVE METABOLIC PANEL
ALT: 18 IU/L (ref 0–32)
AST: 17 IU/L (ref 0–40)
Albumin/Globulin Ratio: 1.8 (ref 1.2–2.2)
Albumin: 4.2 g/dL (ref 3.9–4.9)
Alkaline Phosphatase: 67 IU/L (ref 44–121)
BUN/Creatinine Ratio: 11 (ref 9–23)
BUN: 8 mg/dL (ref 6–24)
Bilirubin Total: 0.3 mg/dL (ref 0.0–1.2)
CO2: 24 mmol/L (ref 20–29)
Calcium: 9.7 mg/dL (ref 8.7–10.2)
Chloride: 102 mmol/L (ref 96–106)
Creatinine, Ser: 0.72 mg/dL (ref 0.57–1.00)
Globulin, Total: 2.4 g/dL (ref 1.5–4.5)
Glucose: 83 mg/dL (ref 70–99)
Potassium: 4.5 mmol/L (ref 3.5–5.2)
Sodium: 139 mmol/L (ref 134–144)
Total Protein: 6.6 g/dL (ref 6.0–8.5)
eGFR: 108 mL/min/{1.73_m2} (ref >59)

## 2022-07-12 LAB — T4, FREE: Free T4: 1.04 ng/dL (ref 0.82–1.77)

## 2022-07-12 LAB — LIPID PANEL
Chol/HDL Ratio: 3.1 ratio (ref 0.0–4.4)
Cholesterol, Total: 156 mg/dL (ref 100–199)
HDL: 51 mg/dL (ref >39)
LDL Chol Calc (NIH): 86 mg/dL (ref 0–99)
Triglycerides: 102 mg/dL (ref 0–149)
VLDL Cholesterol Cal: 19 mg/dL (ref 5–40)

## 2022-07-12 LAB — VITAMIN D 25 HYDROXY (VIT D DEFICIENCY, FRACTURES): Vit D, 25-Hydroxy: 37 ng/mL (ref 30.0–100.0)

## 2022-07-12 LAB — FSH/LH
FSH: 10.2 m[IU]/mL
LH: 27.7 m[IU]/mL

## 2022-07-12 LAB — TSH: TSH: 0.861 u[IU]/mL (ref 0.450–4.500)

## 2022-07-12 LAB — B12 AND FOLATE PANEL
Folate: 13.8 ng/mL (ref >3.0)
Vitamin B-12: 265 pg/mL (ref 232–1245)

## 2022-07-12 LAB — HEMOGLOBIN A1C
Est. average glucose Bld gHb Est-mCnc: 105 mg/dL
Hgb A1c MFr Bld: 5.3 % (ref 4.8–5.6)

## 2022-07-12 LAB — TESTOSTERONE: Testosterone: 15 ng/dL (ref 8–60)

## 2022-07-15 ENCOUNTER — Encounter (HOSPITAL_BASED_OUTPATIENT_CLINIC_OR_DEPARTMENT_OTHER): Payer: Self-pay | Admitting: Nurse Practitioner

## 2022-07-15 ENCOUNTER — Encounter (HOSPITAL_BASED_OUTPATIENT_CLINIC_OR_DEPARTMENT_OTHER): Payer: Self-pay

## 2022-07-20 DIAGNOSIS — Z124 Encounter for screening for malignant neoplasm of cervix: Secondary | ICD-10-CM | POA: Diagnosis not present

## 2022-07-20 DIAGNOSIS — Z01411 Encounter for gynecological examination (general) (routine) with abnormal findings: Secondary | ICD-10-CM | POA: Diagnosis not present

## 2022-07-20 DIAGNOSIS — Z6826 Body mass index (BMI) 26.0-26.9, adult: Secondary | ICD-10-CM | POA: Diagnosis not present

## 2022-07-20 DIAGNOSIS — Z01419 Encounter for gynecological examination (general) (routine) without abnormal findings: Secondary | ICD-10-CM | POA: Diagnosis not present

## 2022-08-05 DIAGNOSIS — F4325 Adjustment disorder with mixed disturbance of emotions and conduct: Secondary | ICD-10-CM | POA: Diagnosis not present

## 2022-08-26 DIAGNOSIS — F4325 Adjustment disorder with mixed disturbance of emotions and conduct: Secondary | ICD-10-CM | POA: Diagnosis not present

## 2022-09-23 DIAGNOSIS — F4325 Adjustment disorder with mixed disturbance of emotions and conduct: Secondary | ICD-10-CM | POA: Diagnosis not present

## 2022-09-26 ENCOUNTER — Ambulatory Visit
Admission: RE | Admit: 2022-09-26 | Discharge: 2022-09-26 | Disposition: A | Discharge status: Home / Self Care | Payer: BC Managed Care – PPO | Source: Ambulatory Visit | Attending: Nurse Practitioner | Admitting: Nurse Practitioner

## 2022-09-26 ENCOUNTER — Other Ambulatory Visit: Payer: Self-pay | Admitting: Nurse Practitioner

## 2022-09-26 DIAGNOSIS — N6321 Unspecified lump in the left breast, upper outer quadrant: Secondary | ICD-10-CM

## 2022-09-26 DIAGNOSIS — R928 Other abnormal and inconclusive findings on diagnostic imaging of breast: Secondary | ICD-10-CM

## 2022-09-26 DIAGNOSIS — R922 Inconclusive mammogram: Secondary | ICD-10-CM | POA: Diagnosis not present

## 2022-10-03 DIAGNOSIS — N882 Stricture and stenosis of cervix uteri: Secondary | ICD-10-CM | POA: Diagnosis not present

## 2022-10-03 DIAGNOSIS — Z30433 Encounter for removal and reinsertion of intrauterine contraceptive device: Secondary | ICD-10-CM | POA: Diagnosis not present

## 2022-11-04 DIAGNOSIS — F4325 Adjustment disorder with mixed disturbance of emotions and conduct: Secondary | ICD-10-CM | POA: Diagnosis not present

## 2022-12-09 DIAGNOSIS — F4325 Adjustment disorder with mixed disturbance of emotions and conduct: Secondary | ICD-10-CM | POA: Diagnosis not present

## 2023-03-06 ENCOUNTER — Ambulatory Visit
Admission: RE | Admit: 2023-03-06 | Discharge: 2023-03-06 | Disposition: A | Discharge status: Home / Self Care | Payer: BC Managed Care – PPO | Source: Ambulatory Visit | Attending: Nurse Practitioner | Admitting: Nurse Practitioner

## 2023-03-06 DIAGNOSIS — N6321 Unspecified lump in the left breast, upper outer quadrant: Secondary | ICD-10-CM

## 2023-03-06 DIAGNOSIS — R928 Other abnormal and inconclusive findings on diagnostic imaging of breast: Secondary | ICD-10-CM

## 2023-07-13 ENCOUNTER — Encounter (HOSPITAL_BASED_OUTPATIENT_CLINIC_OR_DEPARTMENT_OTHER): Payer: Self-pay | Admitting: Family Medicine

## 2023-07-13 ENCOUNTER — Ambulatory Visit (INDEPENDENT_AMBULATORY_CARE_PROVIDER_SITE_OTHER): Payer: BC Managed Care – PPO | Admitting: Family Medicine

## 2023-07-13 VITALS — BP 114/82 | HR 64 | Ht 62.5 in | Wt 151.8 lb

## 2023-07-13 DIAGNOSIS — Z23 Encounter for immunization: Secondary | ICD-10-CM | POA: Diagnosis not present

## 2023-07-13 DIAGNOSIS — E559 Vitamin D deficiency, unspecified: Secondary | ICD-10-CM

## 2023-07-13 DIAGNOSIS — Z0000 Encounter for general adult medical examination without abnormal findings: Secondary | ICD-10-CM | POA: Diagnosis not present

## 2023-07-13 NOTE — Progress Notes (Signed)
Subjective:    CC: Annual Physical Exam  HPI:  Carrie Oconnell is a 42 y.o. presenting for annual physical  I reviewed the past medical history, family history, social history, surgical history, and allergies today and no changes were needed.  Please see the problem list section below in epic for further details.  Past Medical History: Past Medical History:  Diagnosis Date   Blood transfusion without reported diagnosis    2009 with first delivery   Past Surgical History: Past Surgical History:  Procedure Laterality Date   CESAREAN SECTION  x's 2    2009 & 2011   EYE SURGERY  09/2017   Lasik   WISDOM TOOTH EXTRACTION Bilateral 1998   Social History: Social History   Socioeconomic History   Marital status: Married    Spouse name: Not on file   Number of children: Not on file   Years of education: Not on file   Highest education level: Not on file  Occupational History   Not on file  Tobacco Use   Smoking status: Never    Passive exposure: Never   Smokeless tobacco: Never   Tobacco comments:    married, 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    Comment: MIrena  Other Topics Concern   Not on file  Social History Narrative   Works in [employer]   Married   9 and 42 y/o   Social Determinants of Corporate investment banker Strain: Not on file  Food Insecurity: Not on file  Transportation Needs: Not on file  Physical Activity: Not on file  Stress: Not on file  Social Connections: Not on file   Family History: Family History  Problem Relation Age of Onset   Arthritis Other    Diabetes Maternal Grandmother    Hypertension Father    Diabetes  Father    Diabetes Paternal Grandmother    Colon cancer Paternal Grandmother    Hypercholesterolemia Mother    Prostate cancer Maternal Grandfather    Breast cancer Neg Hx    Allergies: No Known Allergies Medications: See med rec.  Review of Systems: No headache, visual changes, nausea, vomiting, diarrhea, constipation, dizziness, abdominal pain, skin rash, fevers, chills, night sweats, swollen lymph nodes, weight loss, chest pain, body aches, joint swelling, muscle aches, shortness of breath, mood changes, visual or auditory hallucinations.  Objective:    BP 114/82 (BP Location: Left Arm, Patient Position: Sitting, Cuff Size: Normal)   Pulse 64   Ht 5' 2.5" (1.588 m)   Wt 151 lb 12.8 oz (68.9 kg)   SpO2 94%   BMI 27.32 kg/m   General: Well Developed, well nourished, and in no acute distress. Neuro: Alert and oriented x3, extra-ocular muscles intact, sensation grossly intact. Cranial nerves II through XII are intact, motor, sensory, and coordinative functions are all intact. HEENT: Normocephalic, atraumatic, pupils equal round reactive to light, neck supple, no masses, no lymphadenopathy, thyroid slightly enlarged, slight irregularity with enlargement. Oropharynx, nasopharynx, external ear canals are unremarkable. Skin: Warm and dry, no rashes noted. Cardiac: Regular rate and rhythm, no murmurs rubs or  gallops. Respiratory: Clear to auscultation bilaterally. Not using accessory muscles, speaking in full sentences. Abdominal: Soft, nontender, nondistended, positive bowel sounds, no masses, no organomegaly. Musculoskeletal: Shoulder, elbow, wrist, hip, knee, ankle stable, and with full range of motion.  Impression and Recommendations:    Wellness examination Assessment & Plan: Routine HCM labs ordered. HCM reviewed/discussed. Anticipatory guidance regarding healthy weight, lifestyle and choices given. Recommend healthy diet.  Recommend approximately 150 minutes/week of moderate  intensity exercise Recommend regular dental and vision exams Always use seatbelt/lap and shoulder restraints Recommend using smoke alarms and checking batteries at least twice a year Recommend using sunscreen when outside Discussed tetanus immunization recommendations, patient agreed to proceed with this today  Orders: -     CBC with Differential/Platelet -     Comprehensive metabolic panel -     Hemoglobin A1c -     Lipid panel -     TSH Rfx on Abnormal to Free T4 -     VITAMIN D 25 Hydroxy (Vit-D Deficiency, Fractures)  Need for tetanus booster  Vitamin D deficiency -     VITAMIN D 25 Hydroxy (Vit-D Deficiency, Fractures)  Patient will be traveling to [place] in the near future and has questions regarding any recommended vaccines for traveling.  I am uncertain of specific recommended vaccines for that region.  Advised that she can check CDC website who provides updated recommendations regarding vaccines for traveling to various regions throughout the world.  If there are vaccines recommended for her planned travel, likely these would need to be done at local health department as these are often not standard and would not be ones that we carry here.  Known thyroid goiter.  Has had prior labs which have been normal including TSH and free T4.  Last thyroid ultrasound was in 2019 which did show diffuse thyromegaly with nodularity.  Nodules were benign appearing and/or did not have characteristics which required additional follow-up or evaluation.  Return in about 1 year (around 07/12/2024) for CPE.   ___________________________________________ Carrol Hougland de [place], MD, ABFM, [facility] [place]

## 2023-07-13 NOTE — Assessment & Plan Note (Signed)
Routine HCM labs ordered. HCM reviewed/discussed. Anticipatory guidance regarding healthy weight, lifestyle and choices given. Recommend healthy diet.  Recommend approximately 150 minutes/week of moderate intensity exercise Recommend regular dental and vision exams Always use seatbelt/lap and shoulder restraints Recommend using smoke alarms and checking batteries at least twice a year Recommend using sunscreen when outside Discussed tetanus immunization recommendations, patient agreed to proceed with this today 

## 2023-07-14 LAB — CBC WITH DIFFERENTIAL/PLATELET
Basophils Absolute: 0 10*3/uL (ref 0.0–0.2)
Basos: 0 %
EOS (ABSOLUTE): 0.2 10*3/uL (ref 0.0–0.4)
Eos: 3 %
Hematocrit: 46 % (ref 34.0–46.6)
Hemoglobin: 14.3 g/dL (ref 11.1–15.9)
Immature Grans (Abs): 0 10*3/uL (ref 0.0–0.1)
Immature Granulocytes: 0 %
Lymphocytes Absolute: 2.3 10*3/uL (ref 0.7–3.1)
Lymphs: 32 %
MCH: 30 pg (ref 26.6–33.0)
MCHC: 31.1 g/dL — ABNORMAL LOW (ref 31.5–35.7)
MCV: 96 fL (ref 79–97)
Monocytes Absolute: 0.7 10*3/uL (ref 0.1–0.9)
Monocytes: 9 %
Neutrophils Absolute: 4.1 10*3/uL (ref 1.4–7.0)
Neutrophils: 56 %
Platelets: 244 10*3/uL (ref 150–450)
RBC: 4.77 x10E6/uL (ref 3.77–5.28)
RDW: 12.1 % (ref 11.7–15.4)
WBC: 7.3 10*3/uL (ref 3.4–10.8)

## 2023-07-14 LAB — COMPREHENSIVE METABOLIC PANEL
ALT: 27 [IU]/L (ref 0–32)
AST: 20 [IU]/L (ref 0–40)
Albumin: 4.4 g/dL (ref 3.9–4.9)
Alkaline Phosphatase: 66 [IU]/L (ref 44–121)
BUN/Creatinine Ratio: 15 (ref 9–23)
BUN: 13 mg/dL (ref 6–24)
Bilirubin Total: 0.5 mg/dL (ref 0.0–1.2)
CO2: 24 mmol/L (ref 20–29)
Calcium: 9.7 mg/dL (ref 8.7–10.2)
Chloride: 102 mmol/L (ref 96–106)
Creatinine, Ser: 0.84 mg/dL (ref 0.57–1.00)
Globulin, Total: 2.6 g/dL (ref 1.5–4.5)
Glucose: 84 mg/dL (ref 70–99)
Potassium: 4.8 mmol/L (ref 3.5–5.2)
Sodium: 138 mmol/L (ref 134–144)
Total Protein: 7 g/dL (ref 6.0–8.5)
eGFR: 89 mL/min/{1.73_m2} (ref >59)

## 2023-07-14 LAB — HEMOGLOBIN A1C
Est. average glucose Bld gHb Est-mCnc: 108 mg/dL
Hgb A1c MFr Bld: 5.4 % (ref 4.8–5.6)

## 2023-07-14 LAB — LIPID PANEL
Chol/HDL Ratio: 3.5 ratio (ref 0.0–4.4)
Cholesterol, Total: 163 mg/dL (ref 100–199)
HDL: 46 mg/dL (ref >39)
LDL Chol Calc (NIH): 104 mg/dL — ABNORMAL HIGH (ref 0–99)
Triglycerides: 66 mg/dL (ref 0–149)
VLDL Cholesterol Cal: 13 mg/dL (ref 5–40)

## 2023-07-14 LAB — VITAMIN D 25 HYDROXY (VIT D DEFICIENCY, FRACTURES): Vit D, 25-Hydroxy: 46.5 ng/mL (ref 30.0–100.0)

## 2023-07-14 LAB — TSH RFX ON ABNORMAL TO FREE T4: TSH: 0.849 u[IU]/mL (ref 0.450–4.500)

## 2023-08-02 DIAGNOSIS — Z01411 Encounter for gynecological examination (general) (routine) with abnormal findings: Secondary | ICD-10-CM | POA: Diagnosis not present

## 2023-08-02 DIAGNOSIS — Z01419 Encounter for gynecological examination (general) (routine) without abnormal findings: Secondary | ICD-10-CM | POA: Diagnosis not present

## 2023-08-02 DIAGNOSIS — Z124 Encounter for screening for malignant neoplasm of cervix: Secondary | ICD-10-CM | POA: Diagnosis not present

## 2023-08-11 LAB — HM PAP SMEAR

## 2023-09-27 ENCOUNTER — Encounter (HOSPITAL_BASED_OUTPATIENT_CLINIC_OR_DEPARTMENT_OTHER): Payer: Self-pay

## 2024-02-05 ENCOUNTER — Other Ambulatory Visit: Payer: Self-pay | Admitting: Family Medicine

## 2024-02-05 DIAGNOSIS — N6320 Unspecified lump in the left breast, unspecified quadrant: Secondary | ICD-10-CM

## 2024-03-07 ENCOUNTER — Ambulatory Visit: Payer: Self-pay | Admitting: Obstetrics and Gynecology

## 2024-03-07 ENCOUNTER — Ambulatory Visit
Admission: RE | Admit: 2024-03-07 | Discharge: 2024-03-07 | Disposition: A | Discharge status: Home / Self Care | Source: Ambulatory Visit | Attending: Family Medicine | Admitting: Family Medicine

## 2024-03-07 DIAGNOSIS — N6320 Unspecified lump in the left breast, unspecified quadrant: Secondary | ICD-10-CM

## 2024-03-07 DIAGNOSIS — R928 Other abnormal and inconclusive findings on diagnostic imaging of breast: Secondary | ICD-10-CM | POA: Diagnosis not present

## 2024-07-15 ENCOUNTER — Encounter (HOSPITAL_BASED_OUTPATIENT_CLINIC_OR_DEPARTMENT_OTHER): Payer: Self-pay | Admitting: Family Medicine

## 2024-07-15 ENCOUNTER — Ambulatory Visit (INDEPENDENT_AMBULATORY_CARE_PROVIDER_SITE_OTHER): Payer: BC Managed Care – PPO | Admitting: Family Medicine

## 2024-07-15 VITALS — BP 99/67 | HR 72 | Temp 98.1°F | Ht 62.5 in | Wt 160.0 lb

## 2024-07-15 DIAGNOSIS — E559 Vitamin D deficiency, unspecified: Secondary | ICD-10-CM | POA: Diagnosis not present

## 2024-07-15 DIAGNOSIS — Z0000 Encounter for general adult medical examination without abnormal findings: Secondary | ICD-10-CM | POA: Diagnosis not present

## 2024-07-15 NOTE — Assessment & Plan Note (Signed)

## 2024-07-15 NOTE — Progress Notes (Signed)
 Subjective:    CC: Annual Physical Exam  HPI: Carrie Oconnell is a 43 y.o. presenting for annual physical  I reviewed the past medical history, family history, social history, surgical history, and allergies today and no changes were needed.  Please see the problem list section below in epic for further details.  Past Medical History: Past Medical History:  Diagnosis Date   Blood transfusion without reported diagnosis    2009 with first delivery   Past Surgical History: Past Surgical History:  Procedure Laterality Date   CESAREAN SECTION  x's 2    2009 & 2011   EYE SURGERY  09/2017   Lasik   WISDOM TOOTH EXTRACTION Bilateral 1998   Social History: Social History   Socioeconomic History   Marital status: Married    Spouse name: Not on file   Number of children: Not on file   Years of education: Not on file   Highest education level: Not on file  Occupational History   Not on file  Tobacco Use   Smoking status: Never    Passive exposure: Never   Smokeless tobacco: Never   Tobacco comments:    married, 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    Comment: MIrena   Other Topics Concern   Not on file  Social History Narrative   Works in [employer]   Married   9 and 43 y/o   Social Drivers of Corporate Investment Banker Strain: Not on [employer] Insecurity: Not on file  Transportation Needs: Not on file  Physical Activity: Not on file  Stress: Not on file  Social Connections: Not on file   Family History: Family History  Problem Relation Age of Onset   Hypercholesterolemia Mother    Osteoporosis Mother    Hypertension Father    Diabetes  Father    Diabetes Maternal Grandmother    Prostate cancer Maternal Grandfather    Diabetes Paternal Grandmother    Colon cancer Paternal Grandmother    Arthritis Other    Breast cancer Neg Hx    Allergies: No Known Allergies Medications: See med rec.  Review of Systems: No headache, visual changes, nausea, vomiting, diarrhea, constipation, dizziness, abdominal pain, skin rash, fevers, chills, night sweats, swollen lymph nodes, weight loss, chest pain, body aches, joint swelling, muscle aches, shortness of breath, mood changes, visual or auditory hallucinations.  Objective:    BP 99/67 (BP Location: Right Arm, Patient Position: Sitting, Cuff Size: Normal)   Pulse 72   Temp 98.1 F (36.7 C) (Oral)   Ht 5' 2.5 (1.588 m)   Wt 160 lb (72.6 kg)   SpO2 97%   BMI 28.80 kg/m   General: Well Developed, well nourished, and in no acute distress. Neuro: Alert and oriented x3, extra-ocular muscles intact, sensation grossly intact. Cranial nerves II through XII are intact, motor, sensory, and coordinative functions are all intact. HEENT: Normocephalic, atraumatic, pupils equal round reactive to light, neck supple, no masses, no lymphadenopathy, thyroid  nonpalpable. Oropharynx, nasopharynx, external ear canals are unremarkable. Skin: Warm and dry, no rashes noted. Cardiac: Regular rate and  rhythm, no murmurs rubs or gallops. Respiratory: Clear to auscultation bilaterally. Not using accessory muscles, speaking in full sentences. Abdominal: Soft, nontender, nondistended, positive bowel sounds, no masses, no organomegaly. Musculoskeletal: Shoulder, elbow, wrist, hip, knee, ankle stable, and with full range of motion.  Impression and Recommendations:    Wellness examination Assessment & Plan: Routine HCM labs ordered. HCM reviewed/discussed. Anticipatory guidance regarding healthy weight, lifestyle and choices given. Recommend healthy diet.  Recommend approximately 150 minutes/week of moderate  intensity exercise Recommend regular dental and vision exams Always use seatbelt/lap and shoulder restraints Recommend using smoke alarms and checking batteries at least twice a year Recommend using sunscreen when outside Discussed immunization recommendations  Orders: -     CBC with Differential/Platelet -     Hemoglobin A1c -     Comprehensive metabolic panel with GFR -     Lipid panel -     TSH Rfx on Abnormal to Free T4 -     VITAMIN D  25 Hydroxy (Vit-D Deficiency, Fractures)  Vitamin D  deficiency -     VITAMIN D  25 Hydroxy (Vit-D Deficiency, Fractures)  Return in about 1 year (around 07/15/2025) for CPE.   ___________________________________________ Susan Ar[place] de [place], MD, ABFM, [facility]

## 2024-07-16 ENCOUNTER — Ambulatory Visit (HOSPITAL_BASED_OUTPATIENT_CLINIC_OR_DEPARTMENT_OTHER): Payer: Self-pay | Admitting: Family Medicine

## 2024-07-16 LAB — CBC WITH DIFFERENTIAL/PLATELET
Basophils Absolute: 0 x10E3/uL (ref 0.0–0.2)
Basos: 0 %
EOS (ABSOLUTE): 0.1 x10E3/uL (ref 0.0–0.4)
Eos: 2 %
Hematocrit: 44 % (ref 34.0–46.6)
Hemoglobin: 14.6 g/dL (ref 11.1–15.9)
Immature Grans (Abs): 0 x10E3/uL (ref 0.0–0.1)
Immature Granulocytes: 0 %
Lymphocytes Absolute: 2.2 x10E3/uL (ref 0.7–3.1)
Lymphs: 33 %
MCH: 31.3 pg (ref 26.6–33.0)
MCHC: 33.2 g/dL (ref 31.5–35.7)
MCV: 94 fL (ref 79–97)
Monocytes Absolute: 0.6 x10E3/uL (ref 0.1–0.9)
Monocytes: 9 %
Neutrophils Absolute: 3.8 x10E3/uL (ref 1.4–7.0)
Neutrophils: 56 %
Platelets: 279 x10E3/uL (ref 150–450)
RBC: 4.66 x10E6/uL (ref 3.77–5.28)
RDW: 12.5 % (ref 11.7–15.4)
WBC: 6.7 x10E3/uL (ref 3.4–10.8)

## 2024-07-16 LAB — COMPREHENSIVE METABOLIC PANEL WITH GFR
ALT: 20 IU/L (ref 0–32)
AST: 19 IU/L (ref 0–40)
Albumin: 4.6 g/dL (ref 3.9–4.9)
Alkaline Phosphatase: 66 IU/L (ref 41–116)
BUN/Creatinine Ratio: 11 (ref 9–23)
BUN: 9 mg/dL (ref 6–24)
Bilirubin Total: 0.7 mg/dL (ref 0.0–1.2)
CO2: 24 mmol/L (ref 20–29)
Calcium: 9.5 mg/dL (ref 8.7–10.2)
Chloride: 102 mmol/L (ref 96–106)
Creatinine, Ser: 0.84 mg/dL (ref 0.57–1.00)
Globulin, Total: 2.3 g/dL (ref 1.5–4.5)
Glucose: 94 mg/dL (ref 70–99)
Potassium: 5 mmol/L (ref 3.5–5.2)
Sodium: 138 mmol/L (ref 134–144)
Total Protein: 6.9 g/dL (ref 6.0–8.5)
eGFR: 89 mL/min/1.73 (ref >59)

## 2024-07-16 LAB — LIPID PANEL
Chol/HDL Ratio: 3.1 ratio (ref 0.0–4.4)
Cholesterol, Total: 164 mg/dL (ref 100–199)
HDL: 53 mg/dL (ref >39)
LDL Chol Calc (NIH): 93 mg/dL (ref 0–99)
Triglycerides: 96 mg/dL (ref 0–149)
VLDL Cholesterol Cal: 18 mg/dL (ref 5–40)

## 2024-07-16 LAB — VITAMIN D 25 HYDROXY (VIT D DEFICIENCY, FRACTURES): Vit D, 25-Hydroxy: 33.6 ng/mL (ref 30.0–100.0)

## 2024-07-16 LAB — TSH RFX ON ABNORMAL TO FREE T4: TSH: 1.03 u[IU]/mL (ref 0.450–4.500)

## 2024-07-16 LAB — HEMOGLOBIN A1C
Est. average glucose Bld gHb Est-mCnc: 103 mg/dL
Hgb A1c MFr Bld: 5.2 % (ref 4.8–5.6)

## 2024-07-17 ENCOUNTER — Other Ambulatory Visit (HOSPITAL_BASED_OUTPATIENT_CLINIC_OR_DEPARTMENT_OTHER): Payer: Self-pay

## 2024-07-17 NOTE — Telephone Encounter (Signed)
 Patient would like to discuss the addition of labs to check estrogen levels, per previous messages.

## 2024-08-21 DIAGNOSIS — Z1331 Encounter for screening for depression: Secondary | ICD-10-CM | POA: Diagnosis not present

## 2024-08-21 DIAGNOSIS — R6882 Decreased libido: Secondary | ICD-10-CM | POA: Diagnosis not present

## 2024-08-21 DIAGNOSIS — R4189 Other symptoms and signs involving cognitive functions and awareness: Secondary | ICD-10-CM | POA: Diagnosis not present

## 2024-08-21 DIAGNOSIS — F411 Generalized anxiety disorder: Secondary | ICD-10-CM | POA: Diagnosis not present

## 2024-08-21 DIAGNOSIS — Z72820 Sleep deprivation: Secondary | ICD-10-CM | POA: Diagnosis not present

## 2024-08-21 DIAGNOSIS — L680 Hirsutism: Secondary | ICD-10-CM | POA: Diagnosis not present

## 2024-08-21 DIAGNOSIS — Z01411 Encounter for gynecological examination (general) (routine) with abnormal findings: Secondary | ICD-10-CM | POA: Diagnosis not present
